# Patient Record
Sex: Male | Born: 1935 | Race: Black or African American | Hispanic: No | Marital: Married
Health system: Southern US, Community
[De-identification: ages and names within clinical notes are randomized; demographics above are authoritative.]

---

## 1999-05-17 ENCOUNTER — Encounter: Payer: Self-pay | Admitting: Neurosurgery

## 1999-05-17 ENCOUNTER — Ambulatory Visit (HOSPITAL_COMMUNITY): Admission: RE | Admit: 1999-05-17 | Discharge: 1999-05-17 | Payer: Self-pay | Admitting: Neurosurgery

## 1999-11-26 ENCOUNTER — Ambulatory Visit (HOSPITAL_COMMUNITY): Admission: RE | Admit: 1999-11-26 | Discharge: 1999-11-26 | Payer: Self-pay | Admitting: Neurosurgery

## 1999-11-26 ENCOUNTER — Encounter: Payer: Self-pay | Admitting: Neurosurgery

## 2000-03-22 ENCOUNTER — Encounter: Admission: RE | Admit: 2000-03-22 | Discharge: 2000-03-22 | Payer: Self-pay | Admitting: *Deleted

## 2000-03-22 ENCOUNTER — Encounter: Payer: Self-pay | Admitting: *Deleted

## 2004-10-01 ENCOUNTER — Inpatient Hospital Stay (HOSPITAL_COMMUNITY): Admission: RE | Admit: 2004-10-01 | Discharge: 2004-10-05 | Payer: Self-pay | Admitting: Orthopedic Surgery

## 2004-10-01 ENCOUNTER — Ambulatory Visit: Payer: Self-pay | Admitting: Physical Medicine & Rehabilitation

## 2004-11-29 ENCOUNTER — Inpatient Hospital Stay (HOSPITAL_COMMUNITY): Admission: EM | Admit: 2004-11-29 | Discharge: 2004-12-01 | Payer: Self-pay | Admitting: Emergency Medicine

## 2004-12-02 ENCOUNTER — Ambulatory Visit: Payer: Self-pay | Admitting: Unknown Physician Specialty

## 2005-10-16 ENCOUNTER — Encounter: Admission: RE | Admit: 2005-10-16 | Discharge: 2005-10-16 | Payer: Self-pay | Admitting: Orthopedic Surgery

## 2005-10-21 ENCOUNTER — Ambulatory Visit (HOSPITAL_COMMUNITY): Admission: RE | Admit: 2005-10-21 | Discharge: 2005-10-22 | Payer: Self-pay | Admitting: Orthopedic Surgery

## 2005-11-30 ENCOUNTER — Encounter: Admission: RE | Admit: 2005-11-30 | Discharge: 2005-12-25 | Payer: Self-pay | Admitting: Orthopedic Surgery

## 2006-06-09 ENCOUNTER — Other Ambulatory Visit: Payer: Self-pay

## 2006-06-09 ENCOUNTER — Inpatient Hospital Stay: Payer: Self-pay | Admitting: Internal Medicine

## 2006-08-11 ENCOUNTER — Ambulatory Visit: Payer: Self-pay | Admitting: Vascular Surgery

## 2006-08-26 ENCOUNTER — Ambulatory Visit (HOSPITAL_COMMUNITY): Admission: RE | Admit: 2006-08-26 | Discharge: 2006-08-27 | Payer: Self-pay | Admitting: Ophthalmology

## 2007-04-07 ENCOUNTER — Ambulatory Visit (HOSPITAL_COMMUNITY): Admission: RE | Admit: 2007-04-07 | Discharge: 2007-04-07 | Payer: Self-pay | Admitting: Urology

## 2007-04-07 ENCOUNTER — Encounter: Admission: RE | Admit: 2007-04-07 | Discharge: 2007-04-07 | Payer: Self-pay | Admitting: Orthopedic Surgery

## 2007-06-20 ENCOUNTER — Inpatient Hospital Stay (HOSPITAL_COMMUNITY): Admission: EM | Admit: 2007-06-20 | Discharge: 2007-06-28 | Payer: Self-pay | Admitting: Emergency Medicine

## 2007-06-20 ENCOUNTER — Ambulatory Visit: Payer: Self-pay | Admitting: Cardiology

## 2007-06-21 ENCOUNTER — Encounter (INDEPENDENT_AMBULATORY_CARE_PROVIDER_SITE_OTHER): Payer: Self-pay | Admitting: Emergency Medicine

## 2007-06-22 ENCOUNTER — Encounter (INDEPENDENT_AMBULATORY_CARE_PROVIDER_SITE_OTHER): Payer: Self-pay | Admitting: Emergency Medicine

## 2007-06-23 ENCOUNTER — Ambulatory Visit: Payer: Self-pay | Admitting: Physical Medicine & Rehabilitation

## 2007-06-28 ENCOUNTER — Inpatient Hospital Stay (HOSPITAL_COMMUNITY)
Admission: RE | Admit: 2007-06-28 | Discharge: 2007-07-14 | Payer: Self-pay | Admitting: Physical Medicine & Rehabilitation

## 2007-07-17 ENCOUNTER — Inpatient Hospital Stay (HOSPITAL_COMMUNITY): Admission: EM | Admit: 2007-07-17 | Discharge: 2007-07-27 | Payer: Self-pay | Admitting: Emergency Medicine

## 2007-07-17 ENCOUNTER — Ambulatory Visit: Payer: Self-pay | Admitting: Internal Medicine

## 2007-07-25 ENCOUNTER — Ambulatory Visit: Payer: Self-pay | Admitting: Vascular Surgery

## 2007-07-25 ENCOUNTER — Encounter (INDEPENDENT_AMBULATORY_CARE_PROVIDER_SITE_OTHER): Payer: Self-pay | Admitting: Internal Medicine

## 2007-08-08 ENCOUNTER — Inpatient Hospital Stay (HOSPITAL_COMMUNITY): Admission: EM | Admit: 2007-08-08 | Discharge: 2007-08-16 | Payer: Self-pay | Admitting: Emergency Medicine

## 2007-08-22 ENCOUNTER — Encounter (INDEPENDENT_AMBULATORY_CARE_PROVIDER_SITE_OTHER): Payer: Self-pay | Admitting: *Deleted

## 2007-08-22 ENCOUNTER — Ambulatory Visit: Payer: Self-pay | Admitting: Internal Medicine

## 2007-08-22 DIAGNOSIS — D638 Anemia in other chronic diseases classified elsewhere: Secondary | ICD-10-CM | POA: Insufficient documentation

## 2007-08-22 DIAGNOSIS — F068 Other specified mental disorders due to known physiological condition: Secondary | ICD-10-CM | POA: Insufficient documentation

## 2007-08-22 DIAGNOSIS — H409 Unspecified glaucoma: Secondary | ICD-10-CM | POA: Insufficient documentation

## 2007-08-22 DIAGNOSIS — M48061 Spinal stenosis, lumbar region without neurogenic claudication: Secondary | ICD-10-CM | POA: Insufficient documentation

## 2007-08-22 DIAGNOSIS — F1020 Alcohol dependence, uncomplicated: Secondary | ICD-10-CM | POA: Insufficient documentation

## 2007-08-22 DIAGNOSIS — Z8679 Personal history of other diseases of the circulatory system: Secondary | ICD-10-CM | POA: Insufficient documentation

## 2007-08-22 DIAGNOSIS — K7030 Alcoholic cirrhosis of liver without ascites: Secondary | ICD-10-CM | POA: Insufficient documentation

## 2007-08-22 DIAGNOSIS — K5732 Diverticulitis of large intestine without perforation or abscess without bleeding: Secondary | ICD-10-CM | POA: Insufficient documentation

## 2007-08-22 DIAGNOSIS — J15212 Pneumonia due to Methicillin resistant Staphylococcus aureus: Secondary | ICD-10-CM | POA: Insufficient documentation

## 2007-08-22 DIAGNOSIS — M810 Age-related osteoporosis without current pathological fracture: Secondary | ICD-10-CM | POA: Insufficient documentation

## 2007-08-22 DIAGNOSIS — Z96649 Presence of unspecified artificial hip joint: Secondary | ICD-10-CM | POA: Insufficient documentation

## 2007-08-22 DIAGNOSIS — M109 Gout, unspecified: Secondary | ICD-10-CM | POA: Insufficient documentation

## 2007-08-22 DIAGNOSIS — M159 Polyosteoarthritis, unspecified: Secondary | ICD-10-CM | POA: Insufficient documentation

## 2007-08-22 DIAGNOSIS — I10 Essential (primary) hypertension: Secondary | ICD-10-CM | POA: Insufficient documentation

## 2007-08-22 LAB — CONVERTED CEMR LAB
Eosinophils Relative: 2 % (ref 0–5)
HCT: 30.1 % — ABNORMAL LOW (ref 39.0–52.0)
Hemoglobin: 9.6 g/dL — ABNORMAL LOW (ref 13.0–17.0)
Lymphocytes Relative: 29 % (ref 12–46)
Lymphs Abs: 1.8 10*3/uL (ref 0.7–4.0)
Monocytes Absolute: 0.7 10*3/uL (ref 0.1–1.0)
WBC: 6.3 10*3/uL (ref 4.0–10.5)

## 2007-09-12 ENCOUNTER — Encounter (INDEPENDENT_AMBULATORY_CARE_PROVIDER_SITE_OTHER): Payer: Self-pay | Admitting: *Deleted

## 2007-09-30 ENCOUNTER — Ambulatory Visit: Payer: Self-pay | Admitting: Infectious Disease

## 2007-09-30 DIAGNOSIS — M25519 Pain in unspecified shoulder: Secondary | ICD-10-CM

## 2008-04-22 IMAGING — CT CT PELVIS W/O CM
2 of 4 series · 17 of 46 positions shown, 19 images · IV contrast (OMNI 300/WATER)
Comparison: 07/21/03.

CLINICAL DATA: ______________
 ABDOMEN CT WITHOUT CONTRAST:
TECHNIQUE: Multidetector CT imaging of the abdomen was performed following the standard protocol without IV contrast.
TECHNIQUE: Multidetector CT imaging of the pelvis was performed following the standard protocol without IV contrast.

[Series 2: routine abdomen · axial · 0.88mm/px · z∈[-426,+9]mm · 14 of 95 slices shown, 16 images]
[im 4/95  soft-tissue]
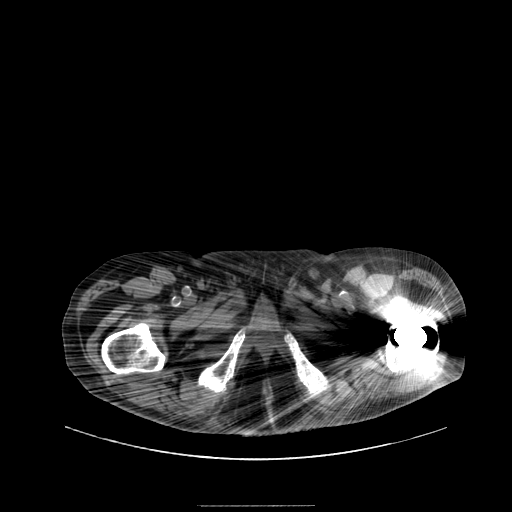
[im 4/95  bone]
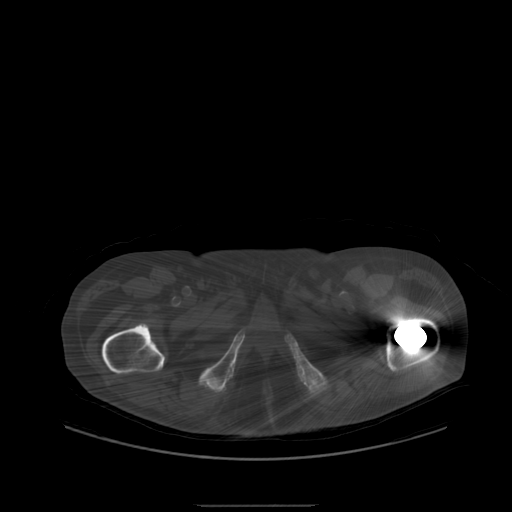
[im 12/95  soft-tissue]
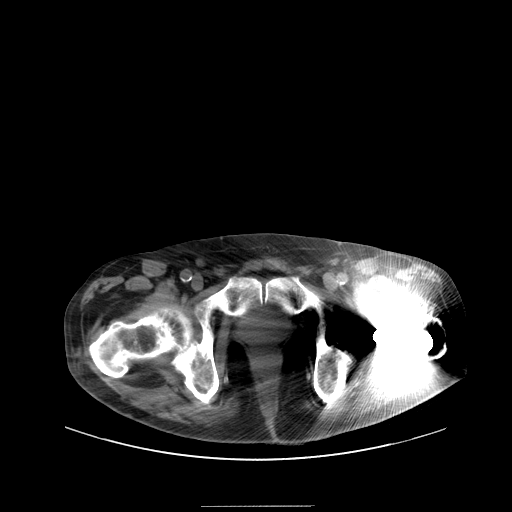
[im 19/95  soft-tissue]
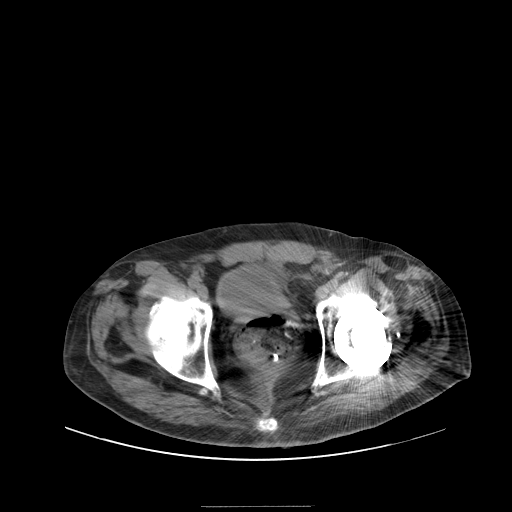
[im 27/95  soft-tissue]
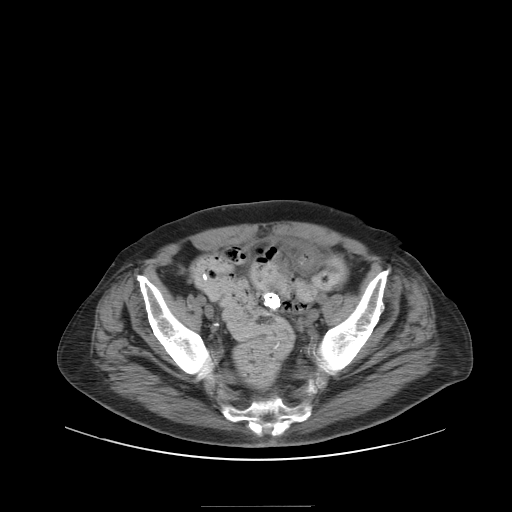
[im 31/95  soft-tissue]
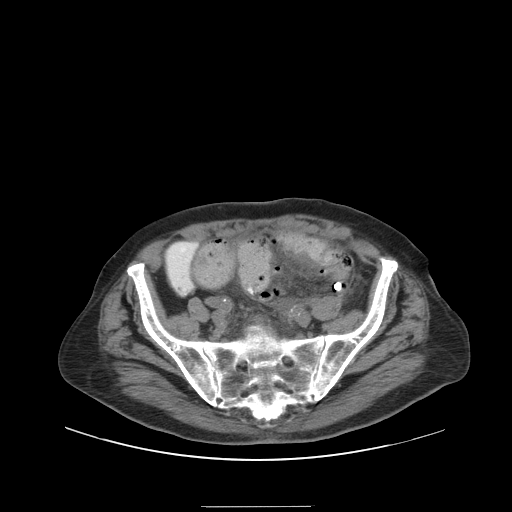
[im 38/95  soft-tissue]
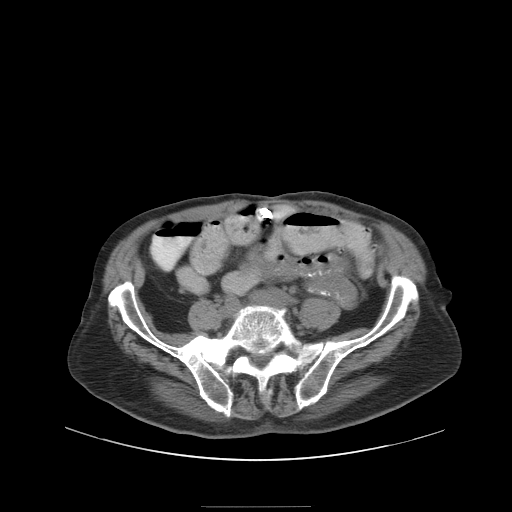
[im 46/95  soft-tissue]
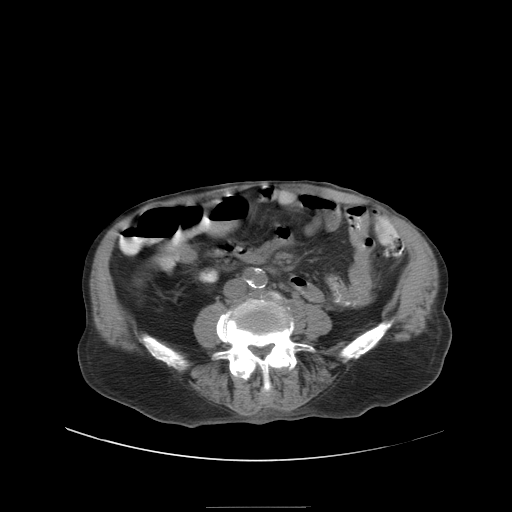
[im 49/95  soft-tissue]
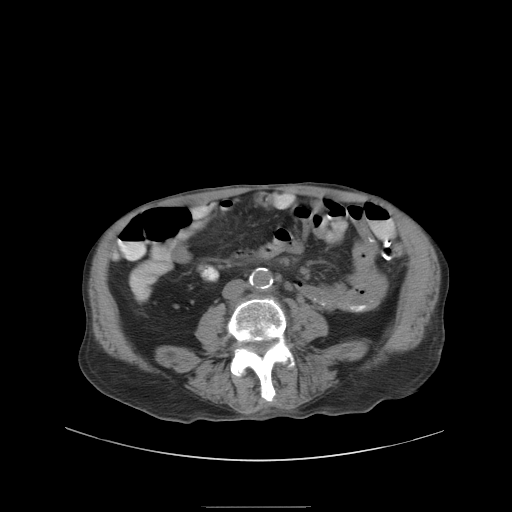
[im 57/95  soft-tissue]
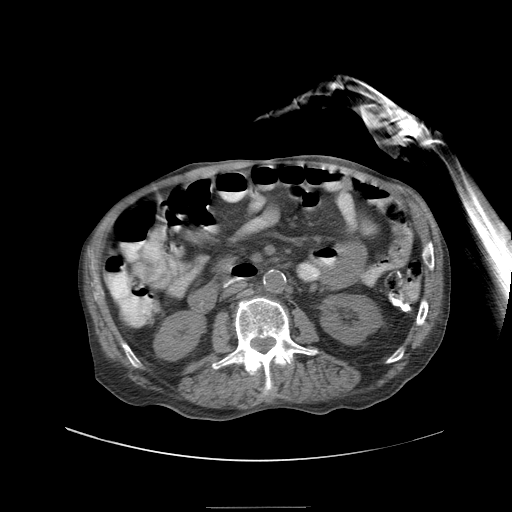
[im 57/95  bone]
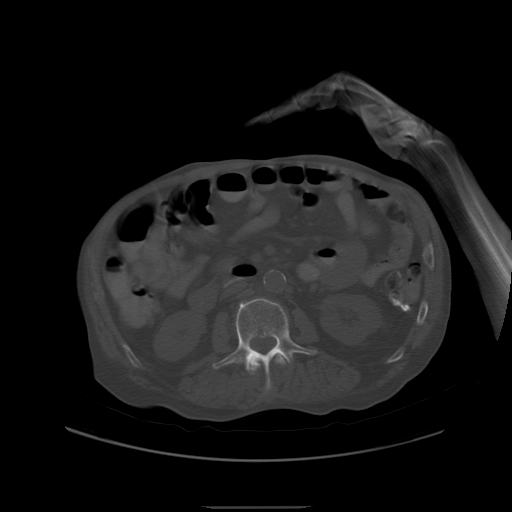
[im 64/95  soft-tissue]
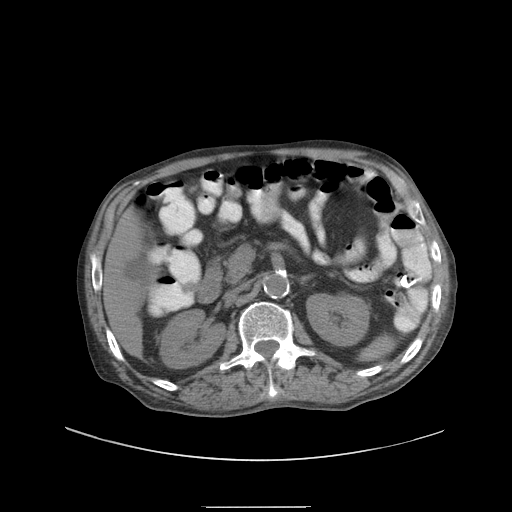
[im 72/95  soft-tissue]
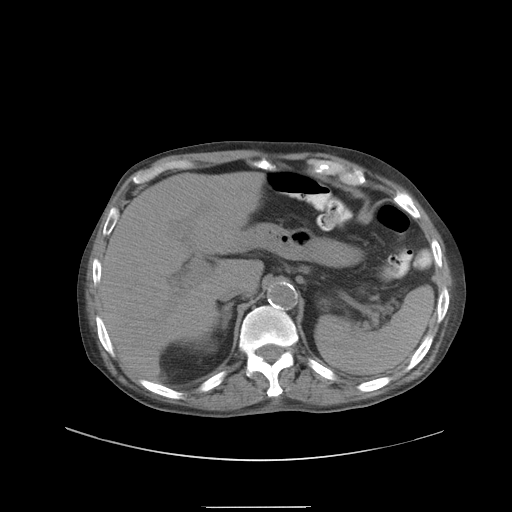
[im 76/95  soft-tissue]
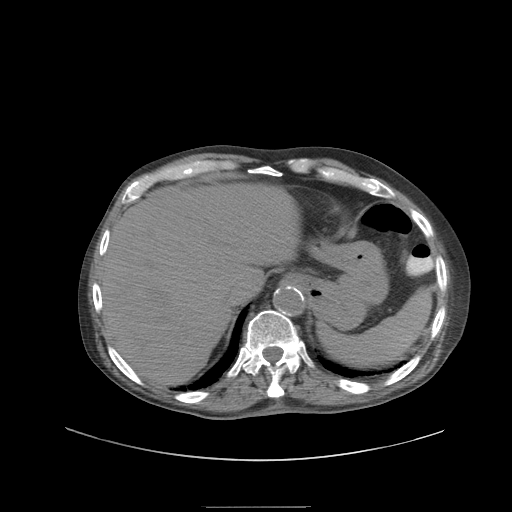
[im 83/95  soft-tissue]
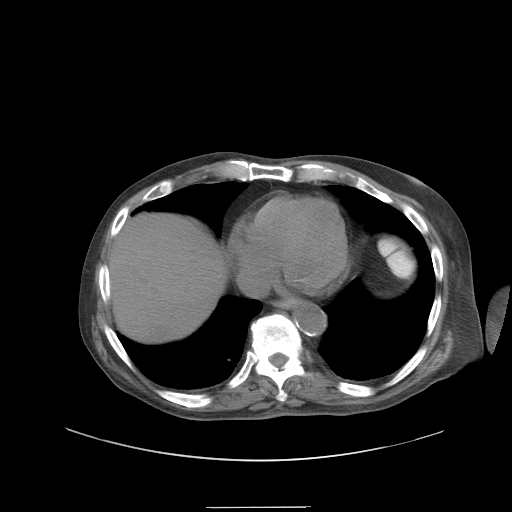
[im 91/95  soft-tissue]
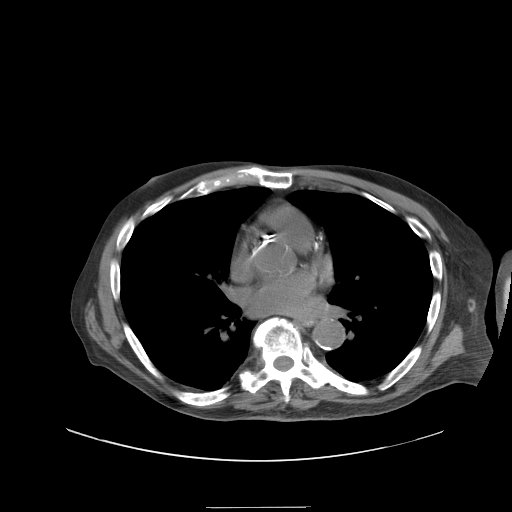

[Series 104: reformatted · coronal · 0.94mm/px · 3 of 101 slices shown]
[im 34/101  soft-tissue]
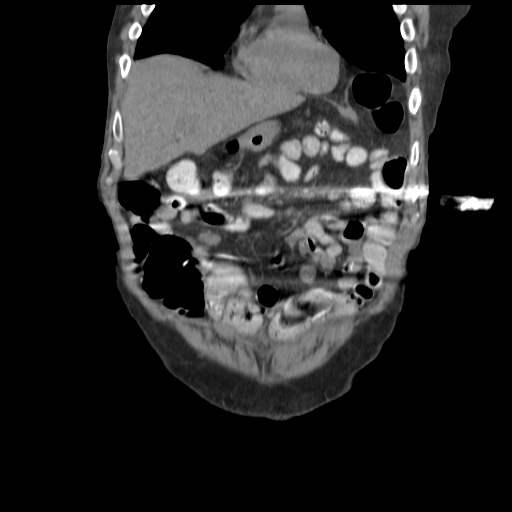
[im 45/101  soft-tissue]
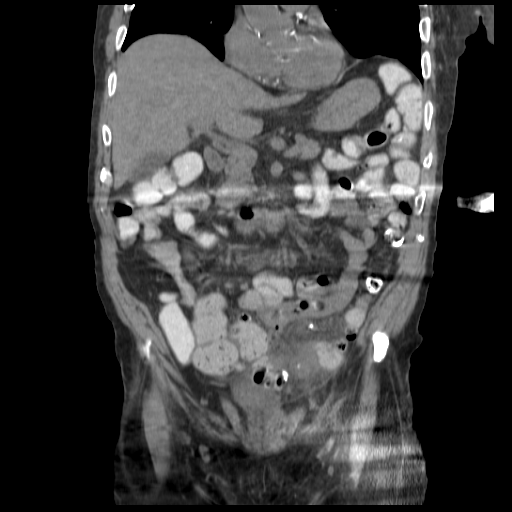
[im 56/101  soft-tissue]
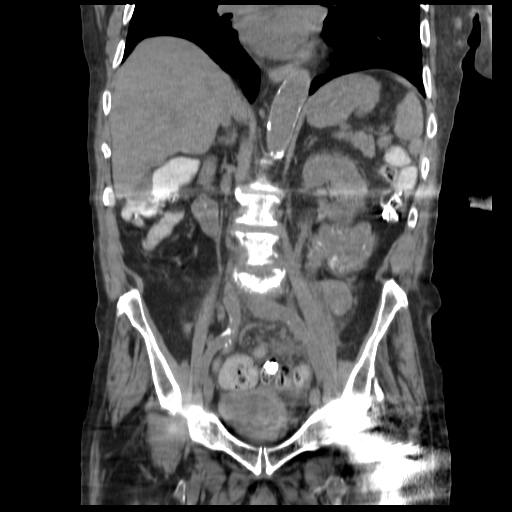

[17 of 46 positions shown; findings below may reference images not displayed]

FINDINGS: The liver, gallbladder, spleen, pancreas, kidneys, and adrenal glands are all stable compared to the recent CT.  Again, gallstones are present.  Stranding in the peritoneal fat of the left lower quadrant is again present.  There is no free fluid or free peritoneal air in the abdomen.  A severe L4 compression deformity is stable.
IMPRESSION: No significant change.  Severe L4 compression deformity is stable.  Gallstones are stable.  Inflammatory change in the peritoneal fat of the left lower quadrant is stable. 
 PELVIS CT WITHOUT CONTRAST:
FINDINGS: Inflammatory changes within the fat adjacent to the sigmoid colon, consistent with diverticulitis has slightly worsened.  There is also a rounded collection of contrast and air approximately 1 cm in diameter with a thick hazy ill-defined wall.  This either represents a severely inflamed large diverticulum or small focal abscess.  It is best seen on image 69.  The total size including the wall and contents is 2.3 cm on image 69.  Stranding in the retroperitoneal fat anterior to the sacrum has slightly progressed.  The bladder is within normal limits.
IMPRESSION: 1.  Findings of diverticulitis have worsened. 
 2.  There is a 2-cm area in the left lower quadrant adjacent to the sigmoid colon containing contrast air surrounded by a thick wall.  It is either a severely inflamed diverticulum or a small focal early abscess cavity.

## 2008-11-15 DEATH — deceased

## 2010-09-30 NOTE — Discharge Summary (Signed)
[name], CORTRIGHT                 ACCOUNT NO.:  [id number]   MEDICAL RECORD NO.:  [id number]          PATIENT TYPE:  INP   LOCATION:  3011                         FACILITY:  [facility]   PHYSICIAN:  Alvester Morin, M.D.  DATE OF BIRTH:  [birth year]   DATE OF ADMISSION:  07/17/2007  DATE OF DISCHARGE:  07/27/2007                               DISCHARGE SUMMARY   DISCHARGE DIAGNOSES:  1. Dementia.  2. Diverticulitis.  3. Orthostatic hypotension secondary to Flomax.  4. Osteoporosis.  5. L4 vertebral fracture.  6. Anemia of chronic disease.  7. Hypertension.  8. Diabetes.  9. History of a transient ischemic attack.  10.Benign prostatic hypertrophy.  11.Diverticulosis.  12.History of left total hip replacement.  13.History of retinal detachment.  14.History of rotator cuff repair in 2007.  15.History of L4-5 spinal stenosis.  16.History of alcohol abuse.  17.Gout.   DISCHARGE MEDICATIONS:  1. Aricept 5 mg p.o. nightly.  2. Celexa 20 mg p.o. daily.  3. Metformin 500 mg p.o. twice daily.  4. Aspirin 81 mg p.o. daily.  5. Metronidazole 500 mg every 8 hours for 5 days.  6. Ciprofloxacin 500 mg every 8 hours for 5 days.  7. Protonix 40 mg p.o. daily.  8. Allopurinol 100 mg p.o. daily.  9. Foltx B12 replacement.  10.Lumigan eye drops one drop in each eye nightly.  11.Alphagan eye drops one drop in each eye in the a.m., one drop in      each eye in the p.m.  12.Calcium carbonate with vitamin D 500/200 one tablet twice daily.  13.Folic acid 1 mg p.o. daily.  14.Thiamine 100 mg p.o. daily.  15.Imodium 2 mg p.o. every 8 hours as needed.   HOSPITAL FOLLOW-UP:  The patient will follow up with the outpatient  clinic internal medicine in 1-2 weeks and we will call him with the  appointment date.  He will need to be followed up in terms of his  dementia, diverticulitis and BPH.   PROCEDURES PERFORMED:  1. Chest x-ray performed July 17, 2007, indicating minimal bibasilar  atelectasis.  2. Two-view of pelvis performed July 17, 2007, indicating no acute      findings.  The patient has a total left hip replacement.  Right hip      osteoarthritis is noted.  3. Lumbar spine plain film performed July 17, 2007, showing acute      compression fracture of the L4 vertebra with multilevel      degenerative change.  4. Four views bilaterally of the knee indicating mild degenerative      changes but no acute findings.  5. CT of the spine performed July 17, 2007, indicating compression      fracture deformity at L4 with approximately 70-80% vertebral body      height loss.  There is moderate spinal stenosis at L3-4.  There is      widespread degenerative joint disease within the rest of the lumbar      spine.  6. Chest x-ray performed on July 20, 2007, indicating bibasilar      atelectasis.  7. CT of the abdomen and pelvis performed on July 21, 2007, indicating      moderate sigmoid diverticulitis without evidence of pericolonic      abscess or obstruction.  No evidence of appendicitis.  8. Shoulder x-rays, bilateral, showing degenerative joint changes in      the right AC joint.   CONSULTATIONS:  None.   BRIEF ADMITTING H&P:  The patient is a 77 year old man with history of  dementia, history of TIA, diabetes type 2, hypertension, alcohol abuse,  L4-5 spinal stenosis, diverticulosis, and questionable history of  hemochromatosis, who comes in for a presyncopal event that happened  yesterday after feeling dizzy and lightheaded on his feet during the  day.  While rising from his chair to go to the bathroom, he felt dizzy,  weak, and proceeded to fall to the ground while his wife and his friend  kept him from falling completely.  He and his family were alert enough  for them to press his LifeGuard and EMS came to his aid.  He denies any  nausea or vomiting or diarrhea the day before.  He has been noted to be  feeling weak recently and has also noted recent alcohol  abuse has been a  chronic problem for him.  He was recently discharged from rehab after  having a TIA and a right clavicle fracture in early February.   HOSPITAL COURSE BY PROBLEM:  1. Presyncopal event:  The original differential diagnosis was alcohol      intoxication, dehydration, orthostasis or arrhythmia or TIA.  The      patient's alcohol level was negative.  He did not have any focal      deficits on exam and his mental status appeared to be at baseline,      so a TIA was unlikely.  We originally monitored the patient on      telemetry and he did not have any significant arrhythmias in the      early part of his admission.  He did, however, have significant      orthostasis.  We offered the patient IV fluids originally for 24      hours.  Unfortunately, his orthostasis did not improve.  We then      held the patient's Flomax, which can cause orthostasis in some      patients, and with some fluid rehydration, his orthostasis      resolved.  We obtained a PT/OT consult for the patient and he was      able to ambulate with a walker.  He does have occasional dizziness      on standing but with adequate hydration, the patient feels much      better while ambulating.  Again, we feel that his presyncopal event      likely was triggered by orthostasis and his orthostasis had      resolved by the time of discharge.  2. Altered mental status:  The patient has a waxing and waning of his      mental status throughout the day.  Occasionally he is very      somnolent and almost unarousable.  At those times his Mini-Mental      Status score can be as low as 15.  At other times he is very much      arousable, normally interactive, and has mild decline in Mini-      Mental Status Exam in the low 20s.  The patient likely has  an      element of dementia, likely worsened by alcoholism.  He had a      recent CT scan that showed chronic vascular changes.  We wanted to      rule out the possibility of  hepatic encephalopathy.  The patient      had repeatedly normal ammonia levels.  Although he does have      evidence of cirrhosis, it is likely that his mental status is      largely contributed to by dementia.  We added Aricept to his      medical regimen and he should continue to take this along with any      other medication in the future that can potentially help his mental      status.  Of note, his RPR was negative.  He had a normal B12,      normal TSH during his admission.  3. Cirrhosis:  The patient has a PT of 1.6 and an albumin of 2.3.  He      has a long history of alcoholism.  He does have two CT scans      negative cirrhotic changes; however, these can be insensitive.  We      will likely need to continue workup of his cirrhosis on the      outpatient basis.  Of note, the patient's LFTs besides his low      albumin were completely normal during his hospitalization.  4. Alcohol abuse:  The patient was maintained on thiamine and folate      during his hospital stay.  He was monitored on CIWA but we did not      have to administer any Ativan.  I counseled the patient on the      necessity of abstaining from alcohol abuse.  I have mentioned to      him that this can potentially damage his liver even further.  His      wife and daughter both understand the importance of maintaining his      sobriety.  We will continue to work with the patient in maintaining      his sobriety in the outpatient clinic.  5. Hypertension:  The patient has mild hypertension.  We treated his      blood pressure without medication while in the hospital given the      patient's orthostasis.  It is not likely that he needs any      antihypertensives at this time.  His blood pressure is to be      monitored on the outpatient basis for any acute changes.  6. Diabetes:  The patient's hemoglobin A1c during this admission was      4.9.  He is well-maintained on metformin at home.  He likely only      needs  metformin 500 mg once daily.  CBGs were stable during his      hospitalization with sliding scale insulin.  7. Anemia of chronic disease:  The patient's hemoglobin was maintained      between 10 and 11 during this hospitalization.  His ferritin was      198, his iron and TIBC were low.  His ferritin was 198.  His folate      was about 20 and his vitamin B12 level was 249.  He likely has      anemia of chronic disease and this should be monitored closely on      the  outpatient basis.  He does have a history of GI bleed in the      past but his FOBT was negative during his hospital stay while not      having diarrhea.  He did have two positives while having diarrhea.  8. Diverticulitis.  The patient developed abdominal pain, nausea and      vomiting during his hospital stay.  He also developed a fever.  We      obtained a CT scan that showed inflammation of his sigmoid colon.      We placed the patient on ciprofloxacin and Flagyl and his fevers,      abdominal pain, nausea and vomiting resolved.  He had some residual      diarrhea, which was treated with Imodium.  Additionally, for his      diarrhea he was C. difficile-negative x2.  We will continue the      Cipro and the metronidazole for at least 5 more days.  Otherwise,      he is stable in regard to this problem.  9. BPH- He has BPH and we had to discontinue his flomax secondary to      orthostatic hypotension. He will need to visit a urologist. His      post void residual was 150.   DISCHARGE VITALS:  Temperature 97.9, blood pressure 123/63, pulse 66,  respirations 18, saturating 100% on room air.      Lollie Sails, MD  Electronically Signed      Alvester Morin, M.D.  Electronically Signed    CB/MEDQ  D:  07/27/2007  T:  07/27/2007  Job:  [id number]

## 2010-09-30 NOTE — H&P (Signed)
[name], Arthur NO.:  [id number]   MEDICAL RECORD NO.:  [id number]          PATIENT TYPE:  INP   LOCATION:  3707                         FACILITY:  [facility]   PHYSICIAN:  Marlan Palau, M.D.  DATE OF BIRTH:  15-Jul-1926   DATE OF ADMISSION:  06/20/2007  DATE OF DISCHARGE:                              HISTORY & PHYSICAL   HISTORY OF PRESENT ILLNESS:  [name] is a 77 year old right-handed  black male born 04/20/1927 with a history of diabetes.  This patient  comes to [facility] via EMS as a code stroke for evaluation of  onset of aphasia syndrome.  The patient was last seen normal at 4:00  p.m. today, was placed on the toilet by his wife and then she left to go  to a store.  When she came back the patient was still on the toilet but  was not speaking properly.  The patient had a fall earlier today,  injured his right shoulder, has fallen 2 to 3 days ago hitting the left  side of the head, sustaining ecchymosis on the frontotemporal area and a  black eye on the left.  Patient has undergone a CT scan of the brain  that shows evidence of some small vessel ischemic changes that appear to  be chronic and some atrophy.  No acute changes were seen.  Blood work is  pending at this time.  NIH stroke scale score is 15.  The patient has  remained aphasic.  The patient is not felt to be a TPA candidate due to  duration of symptoms and due to multiple falls and recent trauma.   PAST MEDICAL HISTORY:  1. New onset aphasia, referable to left brain stroke.  2. Diabetes.  3. Benign prostatic hypertrophy.  4. Gout.  5. Degenerative arthritis status post left total hip replacement.  6. Diverticulosis.  7. Glaucoma.  8. Renal calculi status post lithotripsy.  9. Asthma.  10.Gait disorder with multiple falls.  11.Retinal detachment requiring surgery in the past.  12.History of rotator cuff surgery on the right in the past.   MEDICATIONS:  Medications  include:  1. Celexa, unknown dose.  2. Metformin unknown dose.  3. Flomax presumably 0.4 mg daily.  4. Folate presumably 1 mg daily.  5. Protonix presumably 40 mg daily.  6. Lodine.   ALLERGIES:  The patient has an allergy to IODINE.   SOCIAL HISTORY:  Does not currently smoke.  Actively drinks alcohol in  the form of liquor.  The patient drinks on a daily basis.  No history of  DTs from alcohol withdrawal.   SOCIAL HISTORY:  This patient is married for the second time, lives in  the [place] area.  He is retired.  Has three children,  they are all adults, alive and well.  Again, the patient is not working,  is retired.   FAMILY MEDICAL HISTORY:  Notable for mother and father, both passed  away.  Mother had glaucoma.  Father had diabetes.  The patient has two  living siblings, one brother  and one sister.  Three have passed away.  One with a Downs syndrome and heart disease, another one with an MI.   REVIEW OF SYSTEMS:  Cannot be directly obtained.  According to the  patient's wife, this patient has had some problems with control of the  bowels and the bladder over the last 6 months.  Has had low back pain,  occasional nausea and vomiting, dizziness.  The legs will give out with  walking.  The patient uses a walker to get around.  No blackouts have  been noted.   PHYSICAL EXAMINATION:  VITALS:  Blood pressure is 164/75, heart rate 84,  respiratory rate 15, temperature afebrile.  GENERAL:  This patient is a thin elderly black male who is alert but  aphasic at the time of examination.  HEENT EXAMINATION:  Head is atraumatic.  Eyes: Pupils are postsurgical,  reactive to light.  Disks are flat bilaterally.  NECK:  Neck is relatively supple.  No carotid bruits noted.  RESPIRATORY:  Examination is clear.  CARDIOVASCULAR:  Examination reveals distant heart sounds.  No obvious  murmurs, rubs noted.  EXTREMITIES:  Are without significant edema.  There is ecchymosis  over  the left frontotemporal area, a black eye on the left, ecchymosis over  the right shoulder.  Both knees actively bleeding.  NEUROLOGIC EXAMINATION:  Cranial nerves as above.  The patient again is  alert, has nonsensical speech, has full extraocular movements.  Will  blink to threat bilaterally.  The patient will not respond to pure  verbal commands, has good facial symmetry.  Motor testing reveals a very  definite drift on the right arm but during NIH stroke scale testing all  four extremities will drift down to the bed but he moves the left arm  and both legs fairly well otherwise.  The patient has decreased response  to pinprick sensation on the right arm and legs.  The left, the patient  has depressed but symmetric reflexes throughout.  Toes are neutral  bilaterally.  The patient will not follow directions for finger-nose-  finger or heel-to-shin but no obvious ataxia seen.  Cannot accurately  test double simultaneous stimulation and extinction.  The patient will  not follow verbal commands and will not state his age or month.  The  patient was not ambulated.   LABORATORY VALUES:  So far include white count 7.3, hemoglobin of 10.8,  hematocrit 32.0, MCV of 104.0, platelets of 199.  The rest of the  chemistries are pending at this time.  CT of the head is as above.   IMPRESSION:  1. New onset left brain cerebrovascular event.  2. History of alcohol abuse.  3. Diabetes.   This patient is not a candidate for TPA due to duration of symptoms and  due to multiple falls with the recent bruising.  The patient will be  admitted for further evaluation.  It is possible this patient could be a  candidate for the SENTIS trial.   PLAN:  1. Admission to [facility].  2. MRI of the brain.  3. MRI angiogram.  4. 2-D echocardiogram.  5. PT and OT evaluation.  6. Aspirin therapy.   Will follow patient's clinical course while in-house.      Marlan Palau, M.D.   Electronically Signed     CKW/MEDQ  D:  06/20/2007  T:  06/21/2007  Job:  [id number]   cc:   [facility]  [facility] [place]

## 2010-09-30 NOTE — H&P (Signed)
[name], [name] NO.:  [id number]   MEDICAL RECORD NO.:  [id number]          PATIENT TYPE:  IPS   LOCATION:  4038                         FACILITY:  [facility]   PHYSICIAN:  Ranelle Oyster, M.D.DATE OF BIRTH:  02-25-1927   DATE OF ADMISSION:  06/28/2007  DATE OF DISCHARGE:                              HISTORY & PHYSICAL   CHIEF COMPLAINT:  Gait disorder, acute decreased balance and confusion.   HISTORY OF PRESENT ILLNESS:  This is a 77 year old African American male  with diabetes and alcohol abuse with a fall 2 or 3 days prior to arrival  and a recurrent fall on the day of admission, who presented with a right  shoulder injury and sudden onset of aphasia.  MRI/MRA revealed atrophy  with chronic small-vessel disease.  There was some question of right  proximal PCA stenosis.  MRI of the C-spine was with spondylosis and  without significant stenosis or cord compression, however.  The patient  did have a right distal radius fracture treated with sling.  EEG is  without seizure activity.  Aphasia has since resolved, although the  patient continues to have unsteady gait and has had some confusion and  needed cues for safety.  Neurology has recommended aspirin for CVA  prophylaxis.  The patient is on a phenobarbital taper for alcohol  withdrawal seizure prevention.  The patient did have a syncopal episode  February 6 with poor arousal.  He was bolused with IV fluid and started  back on phenobarbital 30-60 mg q.h.s.  The patient has some reports of  dysphagia and was made n.p.o. as of February 9.  He had another episode  of unresponsiveness yesterday and phenobarbital was ultimately  discontinued.  He remains n.p.o.  The patient was cleared for diet,  however, as of today with a D3 thin-liquid consistency.   REVIEW OF SYSTEMS:  Notable for incontinence, weakness, chronic blurred  vision.  He has had nocturia.  Other pertinent positives listed above.  A full review  is in the written health history section.   PAST MEDICAL HISTORY:  1. Diabetes.  2. Hypertension.  3. Glaucoma.  4. Gout.  5. BPH.  6. Renal calculi, status post lithotripsy.  7. Chronic dizziness and with gait disorder.  8. Degenerative disk disease with moderate stenosis, L4-L5.  9. Retinal detachment with hemorrhage.  10.Right rotator cuff with decompression in 2007.  11.Left total hip replacement.  12.Diverticulosis.  13.Mild dementia.  14.Dysphagia.   FAMILY HISTORY:  Positive for diabetes.   SOCIAL HISTORY:  The patient is married, lives in a one-level house with  2-3 steps to enter.  He is retired.  He quit tobacco 40 years ago.  He  drinks alcohol daily.   FUNCTIONAL HISTORY:  The patient was independent with his walker prior  to arrival but at risk for falling.  Currently he is requiring min to  mod assist for gait and balance.   ALLERGIES:  CODEINE and CRABS.   MEDICATIONS PRIOR TO ARRIVAL:  Celexa, metformin, Flomax and  allopurinol.   LABS:  Hemoglobin 11.4, white count  7.6, platelets 198,000.  Sodium 138,  potassium 4.2, BUN and creatinine 17 and 1.25.   PHYSICAL EXAM:  Blood pressure is 119/70, pulse is 91, respiratory rate  18, temperature 99.3.  The patient is lying in bed.  He is a bit lethargic but does arouse.  He  can follow commands with cues and extra time.  Pupils equally round and reactive to light.  Ear, nose and throat exam  is unremarkable with fair dentition and pink, moist oral mucosa.  NECK:  Supple without JVD or lymphadenopathy.  CHEST:  Notable for a few rhonchi but otherwise clear to auscultation.  HEART:  Regular rate and rhythm without murmur, rubs or gallops.  EXTREMITIES:  No clubbing, cyanosis or edema.  SKIN:  Notable for dry, scaly areas below the knees and in the hands.  NEUROLOGIC:  Cranial nerves II-XII were notable for decreased visual  acuity.  He had some difficulty tracking, more to the left than right.  Deep tendon  reflexes are 1+.  Sensation was subjectively decreased in  the distal limbs although the patient was not overly cooperative with  exam for fine touch.  He did withdraw to pain.  Motor function generally  4/5 in upper extremities and 3+-4/5 in the lower extremities, proximal  less than distal.  The patient did have fair fine motor coordination but  did lack overall range of motion, particularly at the hips.  Patient has  mild bruising in the left periorbital area.  There is also an area  around the right clavicle with some bruising and slight tenderness to  touch.  He did have full active range of motion of both shoulders  without obvious pain today.   ASSESSMENT AND PLAN:  1. Functional deficits secondary to cervical spondylosis, right radius      fx and possible transient ischemic attack in the setting of chronic      gait disorder and alcohol abuse.  Begin comprehensive inpatient      rehab with PT to assess and treat for range of motion      strengthening, bed mobility, transfers, gait and safety.  OT to      assess and treat for range of motion, strengthening ADLs, splinting      and equipment.  Rehab nurse will follow on a 24-hour basis for      bowel, bladder, skin, medication safety and nutritional issues.      Speech/language pathologist will assess and evaluate for dysphagia      and cognition.  Rehab case manager/social worker will assess for      psychosocial needs and discharge planning.  Estimated length of      stay is 2 weeks.  Prognosis is fair to good.  Goal:  Supervision.  2. Hypertension:  Continue to monitor off medications at this point.  3. Diabetes:  Will resume Glucophage and check CBGs.  4. Gout:  Resume allopurinol.  5. Benign prostatic hypertrophy.  Flomax.  Check PVRs and begin a      timed voiding schedule.  6. Mood::  Continue Celexa 20 mg p.o. daily.      Ranelle Oyster, M.D.  Electronically Signed     ZTS/MEDQ  D:  06/28/2007  T:  06/30/2007   Job:  [id number]

## 2010-09-30 NOTE — Discharge Summary (Signed)
[name], [name] NO.:  [id number]   MEDICAL RECORD NO.:  [id number]          PATIENT TYPE:  IPS   LOCATION:  4038                         FACILITY:  [facility]   PHYSICIAN:  [name], M.D.   DATE OF BIRTH:  26-Jul-1926   DATE OF ADMISSION:  06/28/2007  DATE OF DISCHARGE:  07/14/2007                               DISCHARGE SUMMARY   DISCHARGE DIAGNOSES:  1. Transient ischemic attack event.  2. Right clavicle fracture.  3. Diabetes mellitus type 2.  4. History of gout.  5. History of alcohol abuse.   HISTORY OF PRESENT ILLNESS:  The patient is a 77 year old male with  history of diabetes mellitus, alcohol abuse with fall 2-3 days prior to  admission on February 2, and recurrent fall on day of admission with  right shoulder injury as well as sudden onset of aphasia.  MRI/MRA of  brain showed atrophy with chronic small vessel disease, questionable  right proximal PCA stenosis.  MRI of C-spine shows spondylosis without  significant stenosis or cord compression.  The patient was noted to have  right distal clavicle fracture which was treated with sling.  EEG done  showed no evidence of seizure activity.  The patient's aphasia has  resolved.  However, the patient continues to have unsteady gait.  Hospital course has been significant for periods of lethargy probably  due to phenobarbital taper started for prevention of alcohol withdrawal.  This has been discontinued.  Currently the patient is transferred to  rehab for progressive therapies.   PAST MEDICAL HISTORY:  See discharge diagnosis plus history of BPH,  renal calculi requiring lithotripsy, chronic dizziness, DDD of spine  with moderate L4-5 stenosis, history of right retinal detachment and  hemorrhage, right rotator cuff decompression, right total knee  replacement, diverticulosis, gait disorder.   ALLERGIES:  CODEINE and CRABS.   FAMILY HISTORY:  Positive for diabetes.   SOCIAL HISTORY:  The patient  is married and lives in a one-level home  with 2-3 steps at entry and is retired.  He quit tobacco in 1957.  He  has history of one child.   HOSPITAL COURSE:  The patient was admitted to rehab on June 28, 2007, for inpatient therapies to consist of PT/OT daily.  Past admission  blood pressures were monitored on b.i.d. basis.  These have been  reasonably controlled running from 110's to 130 systolic and 60-70  diastolic.  Heart rate has been stable.  Blood sugars were monitored on  q.a.c. and q.h.s. basis.  The patient has been maintained on  carbohydrate modified diet throughout his state.  Blood sugars ranging  in the 90's to 130's range.  Last weight was 69 kg.  The patient has  been continent of bowel and bladder.  He has had some complaints of  right hip pain.  X-rays of right hip does show mild bony  demineralization.  No fracture dislocation of bone destruction.   Labs done past admission revealed hemoglobin 10.7, hematocrit 31.9,  white count 4.9, platelets 239.  Check of lytes revealed mild  hypokalemia with potassium  at 3.3.  This was supplemented briefly.  Recheck labs of February 13, revealed sodium 137, potassium 4 __________  avoidance of alcohol especially with his history of alcohol abuse.  This  has been emphasized to him.  The patient's mood has been stable.  He has  participated in therapy and currently the patient is at minimum to  moderate assist level for self care.  He continues to be limited by  complaints of intermittent hip pain.  The patient is currently able to  ambulate 50 feet x4 with minimal assist.  He requires verbal cues for  safe transfers.  He is able to navigate wheelchair to 100 feet with  supervision to minimal assist.  Difficulty with turns.  Speech therapy  has been working with the patient for cognitive issues.  Currently the  patient requires minimal verbal cues for reasoning for safety concerns  past discharge.  He will follow basic  commands without difficulty.  He  requires supervision for complex multistep commands.  He requires  minimal cues to respond to high level questions.  24-hour supervision  with intermittent assistance is recommended past discharge.  His wife is  to provide this.  He will also continue with progressive home health PT  and OT by [facility] past discharge.  On July 14, 2007, the  patient is discharged to home.   DISCHARGE MEDICATIONS:  1. Allopurinol 100 mg p.o. daily.  2. Flomax 0.4 mg daily.  3. Celexa 20 mg a day.  4. Foltx one p.o. daily.  5. Enteric-coated aspirin 325 mg daily.  6. Multivitamin one daily.  7. Metformin 500 mg daily.  8. Protonix 40 mg daily.  9. Alphagan 0.2% OU q.h.s.  10.Lumigan 0.03% OU q.h.s.  11.Synthroid per home dose.   DIET:  Diabetic diet.   DISCHARGE INSTRUCTIONS:  Do not use etodolac or folic acid.  [facility] to provide PT and OT.  24-hour supervision for now.   FOLLOWUP:  The patient is to follow up with Dr. Dan [name] for routine check  in two weeks and follow up with Dr. Pearlean [name] in four weeks.  Follow up with  Dr. Thomasena [name] as needed.      [name], P.A.    ______________________________  [name], M.D.    PP/MEDQ  D:  07/14/2007  T:  07/15/2007  Job:  [id number]   cc:   Dr. Caryl [name] P. Arthur Brownie, MD

## 2010-09-30 NOTE — Discharge Summary (Signed)
[name], [name]                 ACCOUNT NO.:  [id number]   MEDICAL RECORD NO.:  [id number]          PATIENT TYPE:  INP   LOCATION:  3707                         FACILITY:  [facility]   PHYSICIAN:  Pramod P. Pearlean Brownie, MD    DATE OF BIRTH:  1926/06/13   DATE OF ADMISSION:  06/20/2007  DATE OF DISCHARGE:                               DISCHARGE SUMMARY   CONTINUATION:  He had a fracture of the right clavicle with sling placed  for stabilization and will need follow-up with Dr. Carola Frost in one week on  June 29, 2007, had 2:30 p.m.  Related to stroke, he has risk factors  of hypertension and diabetes as well as alcohol use.  The patient has  been advised to decrease alcohol use and keep his sugars and blood  pressure under control.  He was evaluated by PT and felt he would  benefit from follow-up therapy and home health therapy has been  arranged.  While patient was diagnosed with a left brain stroke, it is  possible that it was merely related to alcohol use and ataxia with  resulting fall.   CONDITION ON DISCHARGE:  The patient alert and oriented to person and  place.  He has no aphasia.  No dysarthria.  His eye movements are full.  Face is symmetric.  He has no focal weakness.  He has his right shoulder  in a sling and it had some drift due to the pain.   DISCHARGE/PLAN:  1. Discharge home with family.  2. Home health PT follow-up.  3. Aspirin for stroke prevention.  4. Stop drinking alcohol.  5. Follow-up primary care physician for risk factor control.  6. Follow-up with Dr. Delia Heady in 2-3 months.      [name], N.P.    ______________________________  [name], MD    SB/MEDQ  D:  06/22/2007  T:  06/23/2007  Job:  [id number]

## 2010-09-30 NOTE — Discharge Summary (Signed)
[name], [name] NO.:  [id number]   MEDICAL RECORD NO.:  [id number]          PATIENT TYPE:  INP   LOCATION:  5022                         FACILITY:  [facility]   PHYSICIAN:  Lollie Sails, MD      DATE OF BIRTH:  1926-12-15   DATE OF ADMISSION:  08/08/2007  DATE OF DISCHARGE:  08/15/3007                               DISCHARGE SUMMARY   DISCHARGE DIAGNOSES:  1. Recurrent diverticulitis with possible abscess formation.  2. Methicillin-resistant Staphylococcus aureus pneumonia.  3. Altered mental status secondary to infections.  4. Dementia.  5. Glaucoma.  6. Alcoholism.  7. Alcoholic cirrhosis.  8. Anemia of chronic disease.  9. Hypertension.  10.History of transient ischemic attack.  11.History of left hip replacement.  12.History of retinal detachment.  13.Spinal stenosis at L4.  14.Gout.  15.Osteoporosis.  16.Subacute L4 vertebral compression fracture.   DISCHARGE MEDICATIONS:  1. Vancomycin 1 gram IV every 18 hours.  2. Compazine 10 mg p.o. every 6 hours as needed.  3. Aricept 5 mg p.o. daily.  4. Metronidazole 500 mg p.o. every 8 hours for 2 weeks.  5. Ciprofloxacin 500 mg p.o. every 8 hours for 2 weeks.  6. Aspirin 81 mg p.o. daily.  7. Celexa 20 mg p.o. daily.  8. Allopurinol 100 mg p.o. daily.  9. Folic acid 1 mg p.o. daily.  10.Thiamine 100 mg p.o. daily.  11.Alphagan 1 drop each eye every morning and evening.  12.Lumigan 1 drop each eye nightly.  13.Th[employer]-It Psychologist, occupational.   HOSPITAL FOLLOWUP:  The patient will follow with Dr. Beverely Pace at the  outpatient clinic at [facility] on August 22, 2007, at 1:30 p.m.  He will  have his diverticulitis and his symptoms associated with it followed up  at that time.  He will also have to his BPH followed up at that time.  He may need a urology consultation.  The patient will also follow with  Dr. Cyndia Bent at [facility] in regard to his  diverticulitis.  He will see Dr.  Jamey Ripa on September 05, 2007, at 11:15 a.m.   PROCEDURES PERFORMED:  1. Acute abdominal series performed on August 08, 2007, indicating left      colon diverticulum and large amount of stool in the rectum.  2. Chest x-ray performed on August 10, 2007, indicating bibasilar      atelectasis, but otherwise no acute cardiopulmonary disease.  3. CT of the abdomen and pelvis performed without contrast on August 08, 2007, indicating worsened diverticulitis since the time of last      CT, which was in early March.  There is a 2-cm area of the left      lower quadrant adjacent to the sigmoid colon containing contrast      area surrounded by thick wall.  It is either severely inflamed      diverticulum or small focal abscess.  4. Chest x-ray performed on August 12, 2007, indicating no acute      cardiopulmonary process.  5. CT scan of the abdomen and  pelvis performed on August 12, 2007,      indicating new right small pleural effusion with some air space      opacity in the right lower lobe, which probably represents      bronchopneumonia.  6. Subacute unchanged L4 compression fracture.  7. Improved diverticulitis, but again there is a 1.5-cm stricture      adjacent to the sigmoid colon, which may reflect an inflamed      diverticulum or small diverticular abscess.  8. Modified barium swallow performed on August 13, 2007, indicating      some dysphagia.  Recommendations made by speech pathology following      MBSS are listed below.   CONSULTATIONS:  Dr. Jamey Ripa from [facility] was consulted  in regard to patient's diverticulitis and possible abscess.  He felt  that the patient will be best treated with antibiotics for now and if he  continues to have any abdominal pain or altered mental status, fevers,  or diarrhea, he may need surgery in the future.   BRIEF ADMITTING HISTORY AND PHYSICAL:  This is a 77 year old  resident  at  a rehab facility with multiple medical problems including  dementia,  recent history of diverticulitis, anemia of chronic disease,  cerebrovascular disease, alcoholic cirrhosis who was last admitted to  the hospital between July 17, 2007, and July 27, 2007.  At that time,  he was treated for diverticulitis.  He was discharged with Cipro and  Flagyl.  He completed these antibiotics on the August 01, 2007.  He is  now presenting with abdominal pain of 3 days' duration located in the  left lower quadrant that is nonradiating, constant, and associated with  fever and loose stools.  He has had at least 1 temperature up to 101  recently.  He denied any nausea or vomiting.  He has been taking p.o.  intake normally.  He denied any problems with chest pain or shortness of  breath.  He has had a significant productive cough over the last week.   PHYSICAL EXAMINATION:  VITAL SIGNS:  Temperature 98.3, blood pressure  114/60, pulse 79, respirations 20, and sating 100% on room air.  GENERAL:  In no acute distress.  The patient is lying recumbent, awake,  and alert.  EYES:  No icterus, no pallor.  Extraocular movements intact.  Pupils are  equally round, reactive to light.  ENT:  Dry mucous membranes are present.  There is no oropharyngeal edema  or erythema.  NECK:  Supple.  No lymphadenopathy.  RESPIRATORY:  Lungs are clear bilaterally with good air movement.  CARDIOVASCULAR:  Regular rate and rhythm.  No murmurs, rubs, or gallop.  S1 and S2 are normal.  GI:  Soft, tender in the left lower quadrant with guarding, but no  rebound tenderness.  No organomegaly, without palpable mass, and good  bowel sounds are present.  RECTAL:  There is decreased rectal tone.  There is incontinence.  EXTREMITY:  No edema with good peripheral pulses present.  SKIN:  Normal skin turgor without rash.  LYMPH:  No lymphadenopathy.  NEURO:  Alert and oriented to place, but not to time.  The patient is  alert to person.  Cranial nerves II through XII are intact.  There are  no  focal deficits.  No deficits to light touch.  PSYCH:  Appropriate.   ADMITTING LABORATORY DATA:  Sodium 139, potassium 3.7, chloride 103,  bicarb 28, BUN 9, creatinine 0.95, glucose 82, and anion  gap is 8.  Hemoglobin 9.6, white count 5.7, platelet count 281.  Alk phos 196, AST  35, ALT 25, protein 7.0, and albumin 2.9.   HOSPITAL COURSE BY PROBLEM:  1. Recurrent diverticulitis with possible abscess formation.  The      patient came in with significant amount of abdominal pain, fevers,      and recent diarrhea.  We got a CT scan that showed recurrent      diverticulitis in the same area that he had earlier in March.  We      placed the patient back on IV ciprofloxacin and metronidazole.  We      obtained a surgery consult because the CT scan initially showed      that there was a possible abscess.  Surgery declined to do surgery      at first and wanted to wait and see if the patient improved with      antibiotics.  He did improve significantly in his abdominal pain      and lessening of his diarrhea and without a return of his fevers.      A CT scan was done on August 12, 2007, that showed improvement of      the diverticulitis, but it did show possible lingering of a      diverticular mass or area of inflammation or area of abscess.  Dr.      Jamey Ripa recommended to continue antibiotics for 2 additional weeks.      At that time, he will visit Dr. Jamey Ripa in his office at [facility].  If the patient is to worsen acutely in regard to      his abdominal pain, his diarrhea, or should he spike fevers again,      he should contact the internal medicine doctors here at [facility]      or Dr. Jamey Ripa in regard to his problems.  For now, 2 more weeks of      ciprofloxacin and Flagyl is planned.  We will follow closely with      him.  2. Methicillin-resistant Staphylococcus aureus pneumonia.  The patient      had a productive cough upon arrival.  He also had altered mental       status in the hospital which prompted a workup of his  to rule out      pneumonia.  Culture and sensitivity of his sputum indicated that he      likely had pneumonia with methicillin-resistant Staphylococcus      aureus.  We started the patient on vancomycin, he received 4 days      while in the hospital.  He will need at least 10 more days.  He      will need to be kept on strict contact precautions given the      severity of his infection in addition to the danger associated with      methicillin-resistant Staphylococcus aureus.  A pharmacist needs to      consistently dose the patient's vancomycin given the importance of      vancomycin troughs and the use of vancomycin for methicillin-      resistant Staphylococcus aureus.  3. Altered mental status.  The patient had a long history of waxing      and waning as far as his orientation in hospitals.  It appears that      his waxing and waning during this hospitalization was  directly      related to his infections.  He was delirious while his infections      were only being partially treated.  It was only after we added      vancomycin to his ciprofloxacin and Flagyl antibiotic regimen that      his mental status began to be cleared consistently.  Right now, the      patient is alert and oriented x3.  He is normally interactive.  He      has capacity  for abstract thought.  His Mini Mental Status exam      shows a score consistently in the mid to low 20s.  If the patient      should have acute decline in his mental status, please contact the      [facility].  It may signal      return  of his diverticulitis or worsening of his pneumonia.  4. Dysphagia.  The patient had significant dysphagia on presentation.      He was choking on treatment.  He was having trouble swallowing both      liquids and solids.  A modified barium swallow confirmed the      dysphagia.  It was found that the patient needed  special techniques      in order to swallow safely.  He will need a pureed diet with      thickened liquids with Archivist .  He will need to      have his medications crushed and placed in the pureed diet in order      to safely swallow them.  If the pills cannot be crushed for some      reason, the patient needs to use a chin-tuck method in order to      safely swallow the required pills.  5. History of diabetes.  The patient had a hemoglobin A1c of 4.9.  The      patient does not have diabetes currently.  He does not need to      continue his metronidazole.  We held his sliding scale insulin as      CBGs were consistently in the non-diabetic range.  We do not feel      that he needs any additional treatment with metformin.  We will      follow him in clinic in regard to this problem.  6. Anemia of  chronic disease.  The patient's baseline hemoglobin is 8      to 10.  He maintained this well while he was in the hospital.  He      did not have any acute drop in his hemoglobin.  He did not have any      episode of melena, hematochezia, or hematemesis.  In the past,      ferritins have always been fairly high in this patient.  We will      continue to monitor his hemoglobin in the outpatient clinic.  7. Benign prostatic hypertrophy.  The patient has significant history      of an enlarged prostate, and has the potential to cause urinary      obstruction.  We discontinued the patient's Flomax at the last      hospitalization because of orthostatic hypotension.  We did start      Proscar 5 mg p.o. daily for the patient.  He will need to visit his  urologist, Dr. Dan Humphreys within the next month.  We will try to get      his rehab facility to make him this appointment as he needs it      soon.   DISCHARGE VITAL SIGNS:  Temperature 97.8, blood pressure 106/72, pulse  80, respirations 18, sating 95% on room air.   DISCHARGE LABORATORY DATA:  Sodium 136, potassium 3.9, chloride  108,  bicarb 25, BUN 5, creatinine 0.89, and glucose 157.      Lollie Sails, MD  Electronically Signed     CB/MEDQ  D:  08/15/2007  T:  08/16/2007  Job:  [id number]   cc:   Currie Paris, M.D.  [facility]/

## 2010-09-30 NOTE — Consult Note (Signed)
[name], [name] NO.:  [id number]   MEDICAL RECORD NO.:  [id number]          PATIENT TYPE:  INP   LOCATION:  5022                         FACILITY:  [facility]   PHYSICIAN:  Currie Paris, M.D.DATE OF BIRTH:  1927/01/21   DATE OF CONSULTATION:  08/09/2007  DATE OF DISCHARGE:                                 CONSULTATION   TIME OF CONSULTATION:  12:15 p.m.   REASON FOR CONSULTATION:  Sigmoid diverticulitis with questionable small  abscess.   HISTORY OF PRESENT ILLNESS:  This is a 77 year old black male with  multiple medical problems including dementia, alcohol abuse, history of  transient ischemic attacks, as well as diabetes and anemia of chronic  disease, who was recently hospitalized from March 1st to March 11th for  diverticulitis.  At this time, the patient was treated with IV Cipro,  Flagyl and, after discharge, the patient was apparently continued on  this until March 16th when the course was then completed.  After  discharge, the patient was sent back to [facility] for  rehab on his legs to help strengthen them.  This past Sunday, March  22nd, the patient began getting a fever and having an increase in left  lower quadrant abdominal pain.  At this time, apparently, the patient  was also having some bouts of diarrhea.  However, there was no blood  seen in this.  The patient denies any nausea or vomiting.  On Monday,  the patient's wife brought him to the emergency department and a repeat  CT scan was done which showed worsening diverticulitis with a possible  tiny 2 cm abscess or an inflamed diverticulum.  White blood cell count  on admission was 5700 and has remained stable at 5600 today.  Due to the  patient's diverticulitis with questionable abscess, we were consulted to  help with the management of this problem, as well as if surgical  intervention was needed.   REVIEW OF SYSTEMS:  See HPI.  Per the patient's wife, she admits  to him  having fever, diarrhea, mental status change, as well as twitching in  all four extremities.  They deny any nausea or vomiting.  Otherwise, all  other systems are negative.   FAMILY HISTORY:  The wife states that the patient's brother has history  of diverticulitis.  Otherwise, family history is noncontributory.   PAST MEDICAL HISTORY:  1. Dementia.  2. Diverticulitis.  3. Orthostatic hypotension secondary to Flomax.  4. Osteoporosis.  5. L4 vertebral fracture.  6. Anemia of chronic disease.  7. Hypertension.  However, the wife states that she is unaware of this      diagnosis and it does not appear, by looking at his medications,      that he is on anything for this.  8. Diabetes.  9. History of a transient ischemic attack.  10.Benign prostatic hypertrophy.  11.Diverticulosis.  12.History of L4-L5 spinal stenosis.  13.History of alcohol abuse.  14.Gout.   PAST SURGICAL HISTORY:  1. A scleral buckle, vitrectomy, retinal photocoagulation I think from  a retinal detachment.  2. Right shoulder arthroscopy.  3. A left total hip arthroplasty.   ALLERGIES:  IODINE, CODEINE and PHENOBARBITAL.   MEDICATIONS:  1. Aricept.  2. Celexa.  3. Metformin.  4. Aspirin.  5. Protonix.  6. Allopurinol.  7. Foltx which is a B12 replacement.  8. Lumigan eye drops.  9. Alphagan eye drops.  10.Calcium carbonate with vitamin D 500/200  11.Folic acid.  12.Thiamine.  13.Imodium.   PHYSICAL EXAM:  GENERAL:  This is a 77 year old black male who is lying  in bed sleeping and does not appear to be in any acute distress.  VITAL SIGNS:  Temperature 97.3, pulse 79, respirations 20, blood  pressure 124/62.  EYES:  Sclerae noninjected and slightly icteric.  Pupils are not  assessed at this time.  EARS/NOSE/MOUTH/THROAT:  Ears and nose without obvious lesions or masses  and no rhinorrhea.  Mouth is pink and moist.  Throat shows no erythema.  Trachea is midline.  NECK:  Is supple.  No  thyromegaly.  LUNGS:  Are clear to auscultation bilaterally with no wheezes, rhonchi  or rales noted.  Respiratory effort is normal.  HEART:  Is regular rate and rhythm with normal S1-S2.  No murmurs,  gallops or rubs are noted with +2 bilateral carotid and pedal pulses.  CHEST:  Is symmetrical.  ABDOMEN:  Is soft with present bowel sounds and nondistended.  The  patient is tender in the left lower quadrant.  However, because he is  sleeping, he only winces and grimaces with pain but the patient also is  guarding with palpation in the left lower quadrant.  Otherwise, there is  no rebounding or any other peritoneal signs.  MUSCULOSKELETAL:  All four extremities are symmetrical with no cyanosis,  clubbing or edema noted.  SKIN:  No obvious rashes, lesions or masses.  NEUROLOGIC:  The patient is currently sleeping and unable to truly  assess his cranial nerves.  However, the patient is having some random  fasciculations and twitching in all four extremities.  PSYCH:  This is also unable to be assessed due to the patient sleeping  and not being able to fully wake up to answer questions.   LABS AND DIAGNOSTICS:  White blood cell count is 5600, hemoglobin is  9.8, hematocrit is 29.2, platelets are 261,000.  Sodium 137, potassium  3.7, BUN 8, creatinine 0.93, glucose 69.  PT is 18.7.  INR is 1.5.  LFTs  are normal except alkaline phosphatase elevation at 196.  The first C.  diff is negative.  Lipase is 46.   Diagnostics:  CT of the abdomen and pelvis which was done yesterday, on  March 23, shows gallstones, severe L4 compression, NSC, sigmoid  diverticulitis which is worse consistent with prior CT scan.  There is a  questionable 2 cm air/contrast collection in the left lower quadrant  that is questioned to be either a tiny abscess versus a severely  inflamed diverticulum.   IMPRESSION:  1. Sigmoid diverticulitis with a possible perforation.  2. Dementia.  3. Alcohol abuse.  4. Gout.   5. Anemia of chronic disease.  6. History of transient ischemic attack.   PLAN:  At this time, we agree with the patient being on an n.p.o. status  and would recommend that this continue until the patient's pain begins  to decrease.  We also agree with the patient being started on IV  antibiotics which are Cipro and Flagyl.  Based on the CT scan, at this  time we do not feel that surgical intervention for a percutaneous drain  is necessary.  However, if the patient begins to worsen from an  abdominal standpoint, these options may have to be considered in the  future.  At this time, we will continue to follow this patient.  Any  further recommendations at this time will be per Dr. Jamey Ripa after his  evaluation of the patient.      Letha Cape, PA      Currie Paris, M.D.  Electronically Signed    KEO/MEDQ  D:  08/09/2007  T:  08/09/2007  Job:  [id number]   cc:   Alvester Morin, M.D.

## 2010-09-30 NOTE — Discharge Summary (Signed)
[name]                 ACCOUNT NO.:  [id number]   MEDICAL RECORD NO.:  [id number]          PATIENT TYPE:  INP   LOCATION:  3707                         FACILITY:  [facility]   PHYSICIAN:  Pramod P. Pearlean Brownie, MD    DATE OF BIRTH:  01-22-1927   DATE OF ADMISSION:  06/20/2007  DATE OF DISCHARGE:  06/22/2007                               DISCHARGE SUMMARY   DIAGNOSES AT TIME OF DISCHARGE:  1. Probable left brain transient ischemic attack.  2. Right distal clavicle fracture.  3. Heavy alcohol abuse.  4. Diabetes.  5. Benign prostatic hypertrophy.  6. Gout.  7. Degenerative joint disease.  8. Glaucoma.  9. Diverticulosis.  10.Renal calculi status post lithotripsy.  11.Asthma.  12.Gait disorder with multiple falls.  13.Retinal detachment requiring surgery in the past.  14.History of rotator cuff surgery on the right in the past.   MEDICINES AT TIME OF DISCHARGE:  1. Etodolac SA 400 mg 2 tablets a day.  2. Allopurinol 140 mg 2 tablets a day.  3. Protonix 40 mg 2 tablets a day.  4. Metformin 500 mg a day.  5. Folic acid 1 mg a day.  6. Flomax 0.4 mg a day.  7. Celexa 20 mg a day.  8. Synthroid dose unknown once a day.  9. Extra Strength Tylenol p.r.n.  10.Aspirin 325 mg a day.   STUDIES PERFORMED:  1. CT of the brain on admission shows no acute abnormality.  2. Cervical spine x-ray shows degenerative changes, no acute findings.  3. Right shoulder x-ray shows distal clavicle fracture, otherwise      negative.  4. MRI of the brain shows atrophy and chronic small vessel disease.  5. MRA of the brain shows no major vessel occlusion.  6. C-spine shows ordinary spondylosis; no cord compressive pathology.  7. A 2-D echocardiogram performed, results pending.  8. Carotid Doppler shows no ICA stenosis.  9. EEG shows no seizure activity.  10.EKG shows normal sinus rhythm, right bundle branch block.   LABORATORY STUDIES:  CBC with hemoglobin 11.4, hematocrit 33.5, MCV  103.7.   Chemistry with glucose 138, otherwise normal.  Coags:  INR 1.4,  PTT 34, PT 17.5.  Liver function tests with total bilirubin 1.6, albumin  2.8.  Cardiac enzymes:  CK 269, CK-MB 4.4, troponin normal.  Cholesterol  181, triglycerides 71, HDL 87, LDL 80.  Urinalysis is negative.  TSH is  2.764.  Vitamin B12 was 303.  RPR is nonreactive.  Homocystine is 19.8.  Hemoglobin A1c 4.7.   HISTORY OF PRESENT ILLNESS:  [name] is a 77 year old, right-  handed, African-American male with a history of diabetes.  The patient  presented to [facility] emergency room via EMS as a code stroke for  evaluation of an aphasia syndrome.  The patient was last seen normal at  4:00 p.m. today when he was placed on the toilet by his wife, and she  left to go to a store.  When she came back, the patient was still on the  toilet but not speaking properly.  The patient had  a fall earlier that  day, injuring his right shoulder, and also fell 2-3 days ago hitting the  left side of his head sustaining ecchymosis on the frontotemporal area  and a black eye on the left.  CT of the brain showed some small-vessel  ischemic change that appeared to be chronic and atrophy, no acute  changes.  NIH stroke scale was 15.  The patient has remained aphasic.  He is not a TPA candidate due to symptoms being greater than 3 hours and  multiple recent falls and trauma this morning.  He was admitted to the  hospital for further evaluation.   HOSPITAL COURSE:  MRI was negative for acute stroke.  When reviewed, it  sounds as if his aphasia may have been likely due to pain that was  present from his right shoulder clavicle fracture earlier that day as  well as extreme alcohol use.  His right shoulder fracture is stable.  He  is placed in a sling, and orthopedics has reviewed films and asked for  follow up within one week.  Appointment was made with Dr. Carola Frost for  follow-up on February 11 at 2:30 p.m.  Related to stroke, he was placed   on aspirin for stroke prevention.  He has vascular risk factors of  diabetes and hypertension.  He needs to follow up with his primary care  physician in one month.  He was evaluated by PT and OT and felt to be  unstable on his feet.  This was not a change from prior to admission.  Will add home health PT for stabilization.      [name], N.P.    ______________________________  [name], MD    SB/MEDQ  D:  06/22/2007  T:  06/23/2007  Job:  [id number]   cc:   Pramod P. Pearlean Brownie, MD  Doralee Albino. Carola Frost, M.D.

## 2010-09-30 NOTE — Procedures (Signed)
EEG NUMBER:  [id number].   CLINICAL HISTORY:  This is a 77 year old patient being evaluated for  altered mental status.   This is a 17-channel portable EEG recorded with the patient awake and  drowsy.   Background awake rhythm consists of 7-8 Hz alpha which is of moderate  amplitude synchronous reactive to eye-opening and closure.  Intermittent  6-7 Hz theta slowing is seen bilaterally in diffuse symmetric fashion  throughout most of the tracing.  No paroxysmal epileptiform activity,  spikes or sharp waves are seen.  Length of the tracing is 20.9 minutes.  Technical component is average.  EKG tracing reveals regular sinus  rhythm.  Hyperventilation and photic stimulation not performed this  being a portable study.   IMPRESSION:  This EEG performed during awake and drowsy states is  abnormal due to presence of mild bihemispheric dysfunctions and  nonspecific finding seen in a variety of toxic, metabolic, infectious  and hypoxic etiologies.  No definite epileptiform features were noted.           ______________________________  [name], MD     ZOX:WRUE  D:  06/21/2007 20:25:45  T:  06/22/2007 14:21:58  Job #:  [id number]

## 2010-09-30 NOTE — Discharge Summary (Signed)
[name], [name]                 ACCOUNT NO.:  [id number]   MEDICAL RECORD NO.:  [id number]          PATIENT TYPE:  INP   LOCATION:  3707                         FACILITY:  [facility]   PHYSICIAN:  Pramod P. Pearlean Brownie, MD    DATE OF BIRTH:  [birth year]   DATE OF ADMISSION:  06/20/2007  DATE OF DISCHARGE:  06/28/2007                               DISCHARGE SUMMARY   ADDENDUM   DISCHARGE DIAGNOSES:  Left brain transient ischemic attack.  Please see  previously dictated discharge summary for rest of diagnoses.   The patient was scheduled to be discharged home on June 22, 2007,  after admission June 20, 2007, for a left brain TIA.  The patient's  wife arrived to pick him up and states she was unable to take him home  due to his change in condition.  The patient has a long history of  alcoholism and unsteady gait.  Wife was concerned she was unable to  handle his unsteady gait at home plus she had plans for surgery on  herself on June 28, 2007.  Skilled nursing facility options were  discussed with her, but she is not interested in nursing home placement  at this point.  She would like to review rehab options.  The patient was  left in hospital and rehab consulted.  As wife plans to take the patient  home, eventually they were willing to accept him for short-term rehab  therapy.  On June 24, 2007, the patient was scheduled for transfer,  but became increasingly lethargic.  Etiology of his lethargy was  unclear.  CT of the brain showed no new acute abnormalities.  Blood work  was performed and again was normal.  The patient was kept over the  weekend in the hospital and on Monday morning, again plans were made for  transfer to rehab.  In the morning, the patient was awake, alert,  sitting up following commands conversant.  Two hours later, just prior  to rehab transfer, the patient became relatively unresponsive, though  would have purposeful movement to sternal rub and not talking  or  following commands.  Again, labs were repeated including ammonia,  chemistry, CBC, urinalysis and phenobarbital level which were all within  normal limits (the patient had been on phenobarbital for alcohol  withdrawal and does had been increased over the weekend).  Rehab  transfer was cancelled on February 9, and again reattempted on June 28, 2007, when the patient again was awake, alert, conversant and  following commands.  The patient transferred to rehab for continuation  of therapies with eventual return home with wife.      [name], N.P.    ______________________________  [name], MD    SB/MEDQ  D:  06/28/2007  T:  06/30/2007  Job:  [id number]

## 2010-10-03 NOTE — H&P (Signed)
NAMEDEJOUR, VOS NO.:  [id number]   MEDICAL RECORD NO.:  [id number]          PATIENT TYPE:  EMS   LOCATION:  MAJO                         FACILITY:  [facility]   PHYSICIAN:  Lonia Blood, M.D.DATE OF BIRTH:  August 07, 1926   DATE OF ADMISSION:  11/29/2004  DATE OF DISCHARGE:                                HISTORY & PHYSICAL   CHIEF COMPLAINT:  Melena with orthostatic symptoms.   HISTORY OF PRESENT ILLNESS:  [name] is a 77 year old gentleman who  resides between [place] and [place] with his wife.  He was in his  usual state of health until last night, when he noticed the onset of  diarrhea.  He describes this diarrhea as loose stools.  He reports that  they have been pitch black dark, and very tarry in appearance, consistent  with melena.  He reports that he has had six to seven episodes of this  diarrhea since the time of his first episode last night.  This has been  associated with intermittent, diffuse abdominal cramps.  He has had  decreased appetite during this period.  He has been feeling weak all over.  The family decided to bring him in after he experienced a bout and was  unable to rise off of the toilet.  The wife came to help him, stood him up  and then he quickly crumpled to the floor because of severe dizziness.  He  did not lose consciousness, but still feels that he is too weak to stand up.  When he sits up in bed he gets significantly dizzy and feels as though he is  going to pass out.  He has not had issues like this before.   He is followed by a gastroenterologist at the [facility] by the name of  Dr. Mechele Collin.  The patient reports that Dr. Mechele Collin has attempted colonoscopy  on multiple occasions, but has failed because of diverticulosis.  The  patient also reports that he has had an EGD before, but says that he has  always been told that it was normal.  He cannot tell me why in fact he had  an EGD.   The patient otherwise  did not have chest pain, fever, chills or even nausea  with vomiting.  He states that he has been taking his Lodine at 400 mg  b.i.d. religiously, and that recently when he ran out of OxyContin he also  took Aleve at a prescribed dosage, for a total of approximately three doses  approximately 36 hr ago.  There has been no bright red blood per rectum.  The patient does not have a history of GI bleeding, as best as he can  recall.   REVIEW OF SYSTEMS:  Comprehensive review of systems is unremarkable, with  the exception of elements in history of present illness above.   PAST MEDICAL HISTORY:  1.  Severe degenerative joint disease of the left hip, status post left      total hip arthroplasty in May 2006.  2.  Status post lithotripsy x2 for nephrolithiasis.  3.  Gout.  4.  Asthma per old medical records -- suspected COPD secondary to prior      tobacco abuse.  5.  Tobacco abuse in the amount of three packs/day x12+ years, discontinued      at age 41.  6.  Known diverticulosis.  7.  Glaucoma.  8.  Diabetes mellitus type 2.   OUTPATIENT MEDICATIONS:  1.  Quinine sulfate -- dosage unknown.  2.  Aleve q.4h. p.r.n.  3.  Lasix -- dose unknown.  4.  Prednisone 10 mg p.o. q.d.  5.  Lodine 400 mg b.i.d.  6.  Flomax 0.4 mg p.o. q.d.  7.  Zyloprim 100 mg p.o. q.d.  8.  Alphagan ophthalmic drops 0.15% solution, one drop in each eye b.i.d.  9.  Albuterol nebulizer p.r.n.  10. OxyContin p.r.n. pain.   ALLERGIES:  IODINE causes hives and swelling.   FAMILY HISTORY:  Noncontributory secondary to age.   SOCIAL HISTORY:  The patient admits to occasional alcohol use.  He does not  use illicit drugs of any kind.  He does have a prior tobacco abuse history,  as noted above.  He is retired from [employer].  He is married and has  children.   DATA REVIEW:  Hemoglobin currently 11.3 with MCV 97.5.  Baseline hemoglobin  appears to be approximately 12.2, dating back to Sep 29, 2004.  White count  is  normal.  Platelets are normal.  Coag's are normal.  pH 7.4, pCO2 39 on  venous gas.  Stool is guaiac positive, as proven by our laboratory assay.  BUN is markedly elevated at 55, with creatinine normal at 0.9.  Electrolytes  are balanced.   PHYSICAL EXAMINATION:  GENERAL:  Well developed, well nourished male in no  acute respiratory distress.  Lying on hospital stretcher.  VITALS:  Temperature 98.9, blood pressure 106/60, heart rate 103,  respiratory rate 24, O2 saturation 98% on room air.  HEENT:  Normocephalic and atraumatic.  Pupils equal, round, reactive to  light and accommodation.  Extraocular muscles intact.  OC/OP clear.  NECK:  No JVD.  No lymphadenopathy.  No thyromegaly.  LUNGS:  Clear to auscultation throughout, without wheezes or rhonchi.  CARDIOVASCULAR:  Tachy but regular rhythm, at approximately 105 beats per  minute.  With a soft 2/6 holosystolic murmur, but no gallop or rub.  ABDOMEN:  Mildly tender to deep palpation throughout.  No focal masses.  No  rebound.  No ascites.  Soft.  Bowel sounds are positive.  RECTAL:  Guaiac positive, with melanotic stool per EDP.  EXTREMITIES:  Two-plus bilateral lower extremity edema to knees, without  erythema or clubbing.  NEUROLOGIC:  Cranial nerves II-XII intact bilaterally.  5/5 strength  bilateral upper and lower extremities.  Intact sensation to touch  throughout.  No Babinski.  CUTANEOUS:  No significant appreciable cutaneous wounds are noted on  examination.   IMPRESSION AND PLAN:  1.  UPPER GASTROINTESTINAL BLEEDING WITH MELENA.  The patient is clearly      suffering with significant upper GI bleed.  He is in marked elevated      NWG:NFAOZHYQMV ratio 55-0.9, suggestive of high protein load from      digested upper GI blood.  He also has clear orthostatic symptoms, not      even requiring quantification due to significant dizziness -- even when     sitting up in the hospital stretcher.  Hemoglobin is currently at 11.3,       but I am  quite sure that this is going to drop significantly below that.   I will place two peripheral IVs.  One will be normal saline locked as a  backup, and the other we will use to administer normal saline at 500 cc  bolus x1; and then, modest fluid at 150 cc/hr.  I am not choosing a higher  rate because of the patient's age and the possibility of pulmonary edema as  a result.  At present, he is hemodynamically stable, though clearly  orthostatic.  Two units of packed red blood cells will be held, and I  anticipate that he will likely need transfusion.  I have discussed this with  him and he is okay with this if necessary.   Clearly, the most likely cause of this is ongoing Lodine use, with addition  of Aleve over 2-3 extra doses.  After he is stabilized we will consider GI  consultation for EGD while here in the hospital.  Another option will be  stabilization and discharge and allow him to undergo EGD per his outpatient  GI doctor.  I would favor in-patient evaluation myself.  A CBC will be  followed on a q.8h. basis, to assure that acute intervention can be provided  as necessary.   1.  ACUTE BLOOD LOSS ANEMIA.  The patient had a mild anemia at present,      which is normocytic.  I suspect that hemoglobin will drop precipitously      as we hydrate him and correct his hemodilution.  Please see the full      details in discussion above.   1.  DIABETES MELLITUS TYPE 2.  The patient's serum glucose on his BMET is      elevated at 165.  We will administer sliding-scale insulin during the      initial portion of his hospital stay, and adjust treatment regimen as      necessary.   1.  GOUT.  I will continue the patient's Zyloprim in attempts to prevent an      acute flare during the patient's hospital stay.  High protein metabolism      secondary to his upper GI bleeding and digestion of blood could, in      fact, precipitate a gouty flare; we will watch closely for this.  Of       course, the treatment of choice if this is to occur, would be elevation      of the patient's prednisone due to his upper GI bleeding.   1.  KNOWN DIVERTICULOSIS.  There is no evidence of a lower GI bleed at this      time.  I do not feel that colonoscopy will be necessary.   1.  GLAUCOMA.  Will continue the patient's outpatient glaucoma medication      and follow closely.      Lonia Blood, M.D.  Electronically Signed     JTM/MEDQ  D:  11/29/2004  T:  11/29/2004  Job:  [id number]

## 2010-10-03 NOTE — Discharge Summary (Signed)
[name], CODE NO.:  [id number]   MEDICAL RECORD NO.:  [id number]          PATIENT TYPE:  INP   LOCATION:  NA                           FACILITY:  [facility]   PHYSICIAN:  Myrtie Neither, MD      DATE OF BIRTH:  Oct 03, 1926   DATE OF ADMISSION:  DATE OF DISCHARGE:                                 DISCHARGE SUMMARY   ADMISSION DIAGNOSES:  1.  Left hip _arthritis  _________.  1.  History of asthma.  2.  History of type 2 diabetes mellitus.   DISCHARGE DIAGNOSES:  1.  Left hip _arthritis  _________.  1.  History of asthma.  2.  History of type 2 diabetes mellitus.   COMPLICATIONS:  None.   INFECTIONS:  None.   OPERATION:  Left total hip arthroplasty done on Oct 01, 2004.   PERTINENT HISTORY:  Left hip pain was progressively worsened with pain and  weightbearing as well as on nonweightbearing.  Pertinent physical is that of  the left hip.  Ankylotic.  Tender anteriorly and laterally.  Pain on  attempted flexion as well as rotation.  Some shortening on the left side.   HOSPITAL COURSE:  The patient had preop medical evaluation and found to be  cleared for surgery.   Patient had laboratories, which were all found to be stable for the patient  to undergo surgery.  Patient with left total hip arthroplasty, fairly  benign.  Patient did receive 2 units of packed cells.  Some postop bleeding.  The patient progressed quite well with physical therapy.  Partial  weightbearing on the left side. Arranged return to the office in 10 days.  Use of Percocet 5 mg q.4h. p.r.n. pain.  Daily INR's to be checked by home  health service.  Patient was discharged improved in stable condition.       AC/MEDQ  D:  10/22/2004  T:  10/22/2004  Job:  [id number]

## 2010-10-03 NOTE — H&P (Signed)
NAMEDAVELL, BECKSTEAD NO.:  [id number]   MEDICAL RECORD NO.:  [id number]          PATIENT TYPE:  INP   LOCATION:  NA                           FACILITY:  [facility]   PHYSICIAN:  Myrtie Neither, MD      DATE OF BIRTH:  14-Jan-1927   DATE OF ADMISSION:  DATE OF DISCHARGE:                                HISTORY & PHYSICAL   CHIEF COMPLAINT:  Painful left hip.   HISTORY OF PRESENT ILLNESS:  This is a 77 year old black male who has been  followed in the office for severe degenerative joint disease of the left  hip.  Patient has been treated with anti-inflammatories and pain medication  and has done fairly well over the past few months but more recently, the  pain has become unbearable, both at rest as well as on attempt at  ambulation.   PAST MEDICAL HISTORY:  1.  Lithotripsy x2.  2.  Gouty arthritis.  3.  Asthma.  4.  Diverticulosis.  5.  Kidney stones.  6.  Glaucoma.  7.  Type 2 diabetes.   ALLERGIES:  IODINE.   SOCIAL HISTORY:  Occasional use of alcohol.  Had smoked three packs per day  x12 years.  Patient states that he quit at age 58.  No history of illegal  chemical use.   FAMILY HISTORY:  Noncontributory.   REVIEW OF SYSTEMS:  Occasional shortness of breath.  Mild asthma.  Low back  pain.  No cardiac or urinary symptoms.   MEDICATIONS:  1.  Aleve 1 tablet q.4h. p.r.n.  2.  Percocet 5 mg q.6h. p.r.n.  3.  Prednisone 20 mg 1/2 tablet daily.  4.  Lodine 400 mg b.i.d.  5.  Flomax 0.4 mg daily.  6.  Zyloprim 100 mg daily.  7.  Alphagan eye drops 0.15% b.i.d. to each eye.  8.  Albuterol p.r.n.  9.  __________.   PHYSICAL EXAMINATION:  VITAL SIGNS:  Temperature 97.7, pulse 75,  respirations 18, blood pressure 118/60.  Height 68-1/2 inches.  Weight 196.  EXTREMITIES:  Patient ambulates with an antalgic gait with limp on the left  side.  Range of motion of the left hip with limited rotation with pain on  flexion and attempted rotation.  The patient does  have some shortening on  the left side.  Neurovascular status is intact.   IMPRESSION:  Severe degenerative arthritis, left hip.   PLAN:  Left total hip arthroplasty.       AC/MEDQ  D:  10/22/2004  T:  10/22/2004  Job:  [id number]

## 2010-10-03 NOTE — Op Note (Signed)
[name], [name]   MEDICAL RECORD NO.:  [name]          PATIENT TYPE:  INP   LOCATION:  NA                           FACILITY:  [facility]   PHYSICIAN:  Myrtie Neither, MD      DATE OF BIRTH:  20-Jun-1926   DATE OF PROCEDURE:  10/01/2004  DATE OF DISCHARGE:                                 OPERATIVE REPORT   PREOPERATIVE DIAGNOSIS:  Severe degenerative arthritis, left hip.   POSTOPERATIVE DIAGNOSIS:  Severe degenerative arthritis, left hip.   ANESTHESIA:  General.   PROCEDURE:  Bovie used for hemostasis.  incision  __________  was made over the left hip, down through the skin and  subcutaneous tissue, down to the fascia lata.  Short rotators were  identified and resected.  _the short rotators  _________  was identified and resected.  Due to severe ankylosis of the hip,  the hip itself was not able to be dislocated.  With the use of permanent  resection about the acetabulum was done, followed by reaming of the  acetabulum.  The reaming of the acetabulum was started out with a 48 mm, and  the acetabulum was reamed down to good bleeding bone to fit a 58 mm cup.  After adequate reaming, trial component was put in place and found to have a  very good tight fit.  Next, attention was turned to the proximal femur.  The  use of a _rasp  _________ was started followed by a canal finder was done to fit an 18 mm  large high-offset stem.  A +1.5 head and neck component was found to be good  and stable with full flexion and extension.  No telescoping.  Final  components were the acetabulum cup of 58 mm, hole eliminator.  The  acetabulum liner was +4, 10 degrees.  The femoral component was an AML,  large stature, high-offset, 18 mm.  The femoral head component was the 36  mm, +1.5.  Again, range of motion was tested.  Full flexion and extension,  good rotation, good leg length, good stability.  Copious and abundant  irrigation was then done.  Wound closure.   The patient was placed in a hip  abduction pillow.  The patient tolerated the procedure quite well and went  to the recovery room in a stable and satisfactory condition.       AC/MEDQ  D:  10/22/2004  T:  10/22/2004  Job:  [id number]

## 2010-10-03 NOTE — Op Note (Signed)
[name], [name] NO.:  [id number]   MEDICAL RECORD NO.:  [id number]          PATIENT TYPE:  AMB   LOCATION:  SDS                          FACILITY:  [facility]   PHYSICIAN:  Myrtie Neither, MD      DATE OF BIRTH:  1926/09/18   DATE OF PROCEDURE:  10/21/2005  DATE OF DISCHARGE:                                 OPERATIVE REPORT   PREOPERATIVE DIAGNOSIS:  Impingement syndrome, rotator cuff tear right  shoulder.   POSTOPERATIVE DIAGNOSIS:  Impingement syndrome, rotator cuff tear right  shoulder.   ANESTHESIA:  General.   PROCEDURE:  1.  Arthroscopic acromioplasty and decompression, right shoulder.  2.  Mini-open rotator cuff repair using one anchor.   The patient was taken to the operating room after given adequate preop  medications.  He was given general anesthesia and intubated.  The right  shoulder was prepped with DuraPrep and draped in a sterile manner.  The  patient was in the barber chair position.  1/2 inch puncture wound was made  posteriorly, a swisher rod was placed posterior to anterior, anterior inflow  water incision was then made.  The lateral incision puncture wound was then  made for the shaver.  Inspection of the shoulder revealed hypertrophic  subacromial bursal sac, eburnation and chondromalacia changes of the  subacromial surface, down sloping of the acromion with the synovial shaver,  complete synovectomy was done, and acromioplasty was done with the use of a  bur.  After adequate acromioplasty and decompression the shoulder, an  incision was made laterally going through the skin and subcutaneous tissue.  The deltoid was split.  The cuff tear was identified anteriorly.  The cuff  tendon was retrieved.  The anchor was placed into the tuberosity and suture  placed through the cuff and pulled it back down to bone which was also  roughened up.  Closure of the cuff tendon approximation was done.  A  separate Mersilene suture was used to reinforce  it.  Copious irrigation was  then done.  The wound closure was then done with 0 Vicryl for the fascia, 2-  0 for the subcutaneous, and skin staples for the skin.  The patient was  placed in a shoulder abduction pillow brace.  The patient tolerated the  procedure quite well and went to the recovery room in stable and  satisfactory condition.   The patient is being discharged tomorrow a.m. after 23 hour observation.  He  will be discharged on Percocet 1-2 q.4h. p.r.n. pain and use of ice packs.  The patient will return back to the office in one week.      Myrtie Neither, MD  Electronically Signed     AC/MEDQ  D:  10/21/2005  T:  10/21/2005  Job:  [id number]

## 2010-10-03 NOTE — Op Note (Signed)
NAMEELISHAH, ASHMORE                 ACCOUNT NO.:  [id number]   MEDICAL RECORD NO.:  [id number]          PATIENT TYPE:  AMB   LOCATION:  SDS                          FACILITY:  [facility]   PHYSICIAN:  John D. Ashley Royalty, M.D. DATE OF BIRTH:  Sep 17, 1926   DATE OF PROCEDURE:  08/26/2006  DATE OF DISCHARGE:                               OPERATIVE REPORT   ADMISSION DIAGNOSES:  1. Rhegmatogenous retinal detachment, right eye.  2. Vitreous hemorrhage, right eye.   PROCEDURES:  1. Scleral buckle, pars plana vitrectomy, membrane peel, retinal      photocoagulation, gas-fluid exchange in the right eye.  2. Endolaser, right eye.   SURGEON:  Alan Mulder, MD   ASSISTANT:  Rosalie Doctor, MA   ANESTHESIA:  General.   DETAILS:  Usual prep and drape, 360-degree limbal peritomy, isolation of  4 rectus muscles 2-0 silk, scleral dissection for 360 degrees to admit a  #279 intrascleral implant, diathermy placed in the bed, additional  posterior dissection carried out beneath the break at 11 .  A 508G  radial segment was made for this area, diathermy placed in the bed.  A  279 implant was placed with a joint at the 4 o'clock position.  A 240  band was placed around the eye with a 270 sleeve at 4 o'clock.  Perforation site chosen at 11 o'clock in the posterior aspect of the  bed.  A large amount of clear colorless subretinal fluid came forth.  The scleral flaps were closed during the egress of the fluid.  Indirect  ophthalmoscopy showed the retina to be thrown into folds and a large  retinal break at 11 o'clock/  Pars plana vitrectomy was begun after  sclerotomies at 8, 10 and 2 o'clock, the infusion port anchored into  place at 8 o'clock, the lighted pick and the cutter placed at 10 and 2  o'clock, respectively, the biome viewing system moved into place,  Provisc placed on the corneal surface.  Pars plana vitrectomy was begun  centrally, where vitreous hemorrhage was encountered; this was carefully  removed under low suction and rapid cutting.  It was apparent that the  retina was very loose and that there was a large serrated break from 10  o'clock to 11 o'clock.  There was vitreous attached to the edge of the  break; this was carefully trimmed with the vitreous cutter.  All the  vitreous was removed from around the retinal break, then down to the  macular surface.  The vitrectomy was carried out into the periphery,  where membranes were peeled from their attachments to the retinal  periphery.  Once all vitreous was removed, the Perfluoron was injected  to reattach the retina.  A simultaneous gas injection was used to  reattach the anterior retina and the gas-Perfluoron interface was at the  level of the break.  Once the break was closed, the Perfluoron was  removed and a total gas exchange was performed.  Perfluoron was rinsed  from the eye with a BSS rinse.  The retina was fully attached at this  point and the  endolaser was positioned in the eye; 1889 burns were  placed around the retinal break and around the retinal periphery with a  power of 1000 milliwatts, 1000 microns each and 0.1 seconds each.  The  room air gas was exchanged for 14% C3F8 and a total fill was obtained.  The [employer] [facility] brush was used to remove all intravitreal liquid.  The instruments were removed from the eye and 9-0 nylon was used to  close the sclerotomy sites.  The buckle was trimmed and adjusted.  The  band was adjusted and trimmed.  The scleral flaps were sutured and the  free ends removed.  The sclerotomy points were closed with 9-0 nylon.  The conjunctiva was reposited with 7-0 chromic suture.  Polymyxin and  gentamicin were irrigated into Tenon's space.  Atropine solution was  applied.  Marcaine was injected around the globe for postop pain.  TobraDex ophthalmic ointment, a patch and shield were placed.  Decadron  10 mg was injected into the lower subconjunctival space.  The closing  pressure  was 15 with a [place] keratometer.  Complications -- none.  Duration -- 2 hours.  The the patient was awakened and taken to Recovery  in satisfactory condition.      Beulah Gandy. Ashley Royalty, M.D.  Electronically Signed     JDM/MEDQ  D:  08/26/2006  T:  08/27/2006  Job:  [id number]

## 2011-02-06 LAB — COMPREHENSIVE METABOLIC PANEL
ALT: 31
AST: 21
AST: 28
Alkaline Phosphatase: 156 — ABNORMAL HIGH
BUN: 19
BUN: 9
CO2: 22
CO2: 25
CO2: 26
Calcium: 8.9
Calcium: 8.9
Calcium: 9
Chloride: 103
Chloride: 96
Creatinine, Ser: 1.25
Creatinine, Ser: 1.39
GFR calc Af Amer: >60
GFR calc non Af Amer: 49 — ABNORMAL LOW
GFR calc non Af Amer: 56 — ABNORMAL LOW
GFR calc non Af Amer: >60
Glucose, Bld: 120 — ABNORMAL HIGH
Glucose, Bld: 121 — ABNORMAL HIGH
Potassium: 3.3 — ABNORMAL LOW
Sodium: 132 — ABNORMAL LOW
Total Bilirubin: 1.2
Total Bilirubin: 1.2
Total Protein: 6.4
Total Protein: 6.4

## 2011-02-06 LAB — BASIC METABOLIC PANEL
Calcium: 9.3
Chloride: 98
Creatinine, Ser: 1.07
Creatinine, Ser: 1.07
GFR calc Af Amer: >60
GFR calc Af Amer: >60
GFR calc non Af Amer: >60
Potassium: 4.4
Sodium: 137

## 2011-02-06 LAB — DIFFERENTIAL
Basophils Absolute: 0
Basophils Absolute: 0
Basophils Relative: 0
Eosinophils Absolute: 0.1
Eosinophils Relative: 0
Lymphocytes Relative: 20
Neutro Abs: 4.9
Neutrophils Relative %: 58

## 2011-02-06 LAB — URINE CULTURE

## 2011-02-06 LAB — RPR: RPR Ser Ql: NONREACTIVE

## 2011-02-06 LAB — VITAMIN B12: Vitamin B-12: 303 (ref 211–911)

## 2011-02-06 LAB — CBC
HCT: 32 — ABNORMAL LOW
HCT: 33.5 — ABNORMAL LOW
HCT: 33.5 — ABNORMAL LOW
Hemoglobin: 10.7 — ABNORMAL LOW
Hemoglobin: 11.4 — ABNORMAL LOW
MCHC: 33.6
MCHC: 33.7
MCV: 102.9 — ABNORMAL HIGH
MCV: 104 — ABNORMAL HIGH
Platelets: 199
RBC: 3.09 — ABNORMAL LOW
RBC: 3.23 — ABNORMAL LOW
RBC: 3.26 — ABNORMAL LOW
RDW: 14.8
RDW: 14.9
WBC: 4.9
WBC: 7.6
WBC: 7.6

## 2011-02-06 LAB — URINALYSIS, ROUTINE W REFLEX MICROSCOPIC
Nitrite: NEGATIVE
Nitrite: NEGATIVE
Protein, ur: NEGATIVE
Specific Gravity, Urine: 1.017
Specific Gravity, Urine: 1.022
Urobilinogen, UA: 1
Urobilinogen, UA: 2 — ABNORMAL HIGH

## 2011-02-06 LAB — LIPID PANEL
Cholesterol: 181
Total CHOL/HDL Ratio: 2.1
VLDL: 14

## 2011-02-06 LAB — CARDIAC PANEL(CRET KIN+CKTOT+MB+TROPI): Relative Index: 1.6

## 2011-02-06 LAB — PROTIME-INR
Prothrombin Time: 17.5 — ABNORMAL HIGH
Prothrombin Time: 17.9 — ABNORMAL HIGH

## 2011-02-06 LAB — PHENOBARBITAL LEVEL: Phenobarbital: <5 — ABNORMAL LOW

## 2011-02-06 LAB — HEMOGLOBIN A1C: Hgb A1c MFr Bld: 4.7

## 2011-02-06 LAB — TROPONIN I: Troponin I: <0.01

## 2011-02-06 LAB — HOMOCYSTEINE: Homocysteine: 19.8 — ABNORMAL HIGH

## 2011-02-06 LAB — MAGNESIUM: Magnesium: 1.8

## 2011-02-09 LAB — BASIC METABOLIC PANEL
BUN: 13
BUN: 8
BUN: 4 — ABNORMAL LOW
BUN: 5 — ABNORMAL LOW
CO2: 16 — ABNORMAL LOW
CO2: 19
CO2: 21
CO2: 25
CO2: 26
CO2: 26
CO2: 27
Calcium: 8.4
Calcium: 8.2 — ABNORMAL LOW
Calcium: 8.3 — ABNORMAL LOW
Calcium: 8.4
Calcium: 8.8
Chloride: 106
Chloride: 111
Chloride: 101
Chloride: 101
Chloride: 104
Chloride: 99
Creatinine, Ser: 0.89
Creatinine, Ser: 0.9
Creatinine, Ser: 0.96
Creatinine, Ser: 0.89
Creatinine, Ser: 0.89
Creatinine, Ser: 0.96
GFR calc Af Amer: >60
GFR calc Af Amer: >60
GFR calc Af Amer: >60
GFR calc Af Amer: >60
GFR calc Af Amer: >60
GFR calc Af Amer: >60
GFR calc Af Amer: >60
GFR calc Af Amer: >60
GFR calc non Af Amer: >60
GFR calc non Af Amer: >60
GFR calc non Af Amer: >60
GFR calc non Af Amer: >60
GFR calc non Af Amer: >60
Glucose, Bld: 60 — ABNORMAL LOW
Glucose, Bld: 74
Glucose, Bld: 107 — ABNORMAL HIGH
Glucose, Bld: 157 — ABNORMAL HIGH
Glucose, Bld: 83
Potassium: 4.1
Potassium: 4.5
Potassium: 3.4 — ABNORMAL LOW
Potassium: 3.6
Potassium: 3.7
Potassium: 4.3
Sodium: 134 — ABNORMAL LOW
Sodium: 134 — ABNORMAL LOW
Sodium: 139
Sodium: 140
Sodium: 131 — ABNORMAL LOW
Sodium: 133 — ABNORMAL LOW
Sodium: 134 — ABNORMAL LOW
Sodium: 137

## 2011-02-09 LAB — CBC
HCT: 30.8 — ABNORMAL LOW
HCT: 31.1 — ABNORMAL LOW
HCT: 31.7 — ABNORMAL LOW
HCT: 33.4 — ABNORMAL LOW
HCT: 25.2 — ABNORMAL LOW
HCT: 26.1 — ABNORMAL LOW
HCT: 27.5 — ABNORMAL LOW
HCT: 29.2 — ABNORMAL LOW
HCT: 29.2 — ABNORMAL LOW
Hemoglobin: 10.3 — ABNORMAL LOW
Hemoglobin: 11.1 — ABNORMAL LOW
Hemoglobin: 11.2 — ABNORMAL LOW
Hemoglobin: 8.8 — ABNORMAL LOW
Hemoglobin: 9.3 — ABNORMAL LOW
Hemoglobin: 9.6 — ABNORMAL LOW
Hemoglobin: 9.8 — ABNORMAL LOW
MCHC: 33.6
MCHC: 33.6
MCHC: 34.2
MCHC: 34.4
MCHC: 33.6
MCHC: 33.9
MCHC: 33.9
MCV: 95.5
MCV: 97.1
MCV: 97.3
MCV: 97.4
MCV: 99.2
MCV: 91.8
MCV: 92.4
MCV: 92.7
MCV: 94
Platelets: 238
Platelets: 240
Platelets: 241
Platelets: 249
Platelets: 273
Platelets: 281
RBC: 3.16 — ABNORMAL LOW
RBC: 3.19 — ABNORMAL LOW
RBC: 3.36 — ABNORMAL LOW
RBC: 3.36 — ABNORMAL LOW
RBC: 2.8 — ABNORMAL LOW
RBC: 2.82 — ABNORMAL LOW
RBC: 3.07 — ABNORMAL LOW
RBC: 3.11 — ABNORMAL LOW
RDW: 13.7
RDW: 13.7
RDW: 13.9
RDW: 13.9
RDW: 13.9
RDW: 14.1
WBC: 3.5 — ABNORMAL LOW
WBC: 3.7 — ABNORMAL LOW
WBC: 5.6
WBC: 5.4
WBC: 5.7

## 2011-02-09 LAB — URINALYSIS, ROUTINE W REFLEX MICROSCOPIC
Bilirubin Urine: NEGATIVE
Hgb urine dipstick: NEGATIVE
Hgb urine dipstick: NEGATIVE
Ketones, ur: 15 — AB
Ketones, ur: 15 — AB
Nitrite: NEGATIVE
Nitrite: NEGATIVE
Protein, ur: NEGATIVE
Protein, ur: NEGATIVE
Specific Gravity, Urine: 1.019
Urobilinogen, UA: 0.2
Urobilinogen, UA: 0.2
Urobilinogen, UA: 1

## 2011-02-09 LAB — CARDIAC PANEL(CRET KIN+CKTOT+MB+TROPI)
CK, MB: 1.5
Relative Index: INVALID
Total CK: 38
Troponin I: 0.01
Troponin I: <0.01

## 2011-02-09 LAB — COMPREHENSIVE METABOLIC PANEL
ALT: 14
AST: 14
Albumin: 2.3 — ABNORMAL LOW
Albumin: 3.3 — ABNORMAL LOW
Albumin: 2.9 — ABNORMAL LOW
Alkaline Phosphatase: 127 — ABNORMAL HIGH
BUN: 10
BUN: 4 — ABNORMAL LOW
BUN: 9
CO2: 24
Calcium: 8.6
Calcium: 8.1 — ABNORMAL LOW
Chloride: 105
Chloride: 105
Chloride: 103
Creatinine, Ser: 0.91
Creatinine, Ser: 0.94
Creatinine, Ser: 0.95
GFR calc Af Amer: >60
GFR calc non Af Amer: >60
GFR calc non Af Amer: >60
Glucose, Bld: 127 — ABNORMAL HIGH
Glucose, Bld: 82
Potassium: 4
Sodium: 137
Total Bilirubin: 0.7
Total Bilirubin: 0.8
Total Bilirubin: 1.1
Total Protein: 7.3
Total Protein: 6.6

## 2011-02-09 LAB — DIFFERENTIAL
Basophils Absolute: 0
Basophils Absolute: 0
Basophils Relative: 0
Basophils Relative: 1
Eosinophils Absolute: 0.1
Eosinophils Relative: 1
Eosinophils Relative: 3
Lymphocytes Relative: 37
Lymphocytes Relative: 27
Lymphs Abs: 1.2
Monocytes Absolute: 0.6
Monocytes Absolute: 0.7
Monocytes Absolute: 0.6
Monocytes Relative: 11
Neutro Abs: 3.4
Neutrophils Relative %: 60

## 2011-02-09 LAB — AMMONIA
Ammonia: 8 — ABNORMAL LOW
Ammonia: 30

## 2011-02-09 LAB — CULTURE, BLOOD (ROUTINE X 2)
Culture: NO GROWTH
Culture: NO GROWTH

## 2011-02-09 LAB — CLOSTRIDIUM DIFFICILE EIA
C difficile Toxins A+B, EIA: NEGATIVE
C difficile Toxins A+B, EIA: NEGATIVE

## 2011-02-09 LAB — TROPONIN I: Troponin I: <0.01

## 2011-02-09 LAB — CK TOTAL AND CKMB (NOT AT ARMC)
CK, MB: 2
Total CK: 44

## 2011-02-09 LAB — CULTURE, RESPIRATORY W GRAM STAIN

## 2011-02-09 LAB — URINE MICROSCOPIC-ADD ON

## 2011-02-09 LAB — PROTIME-INR
INR: 1.5
Prothrombin Time: 18.7 — ABNORMAL HIGH

## 2011-02-09 LAB — EXPECTORATED SPUTUM ASSESSMENT W GRAM STAIN, RFLX TO RESP C

## 2011-02-09 LAB — IRON AND TIBC
Iron: 19 — ABNORMAL LOW
TIBC: 208 — ABNORMAL LOW
UIBC: 189

## 2011-02-09 LAB — OCCULT BLOOD X 1 CARD TO LAB, STOOL
Fecal Occult Bld: POSITIVE
Fecal Occult Bld: POSITIVE
Fecal Occult Bld: NEGATIVE

## 2011-02-09 LAB — FERRITIN: Ferritin: 198 (ref 22–322)

## 2011-02-09 LAB — APTT: aPTT: 42 — ABNORMAL HIGH

## 2011-02-09 LAB — LIPASE, BLOOD
Lipase: 27
Lipase: 46

## 2011-02-09 LAB — RETICULOCYTES: Retic Count, Absolute: 39.6

## 2011-02-09 LAB — ETHANOL: Alcohol, Ethyl (B): <5

## 2011-02-09 LAB — HEMOGLOBIN A1C: Hgb A1c MFr Bld: 4.9

## 2011-02-09 LAB — TSH: TSH: 2.412

## 2020-03-04 ENCOUNTER — Telehealth: Payer: Self-pay | Admitting: *Deleted

## 2020-03-04 NOTE — Telephone Encounter (Signed)
This pt is not a pt here.

## 2020-03-04 NOTE — Telephone Encounter (Signed)
Patient returned call saying Carollee Herter had tried to phone his daughter about him.  I checked Shannon's messages and I do not see a note.
